# Patient Record
Sex: Male | Born: 1940
Health system: Southern US, Community
[De-identification: ages and names within clinical notes are randomized; demographics above are authoritative.]

## PROBLEM LIST (undated history)

## (undated) DIAGNOSIS — R011 Cardiac murmur, unspecified: Secondary | ICD-10-CM

## (undated) DIAGNOSIS — C61 Malignant neoplasm of prostate: Secondary | ICD-10-CM

## (undated) DIAGNOSIS — I714 Abdominal aortic aneurysm, without rupture, unspecified: Secondary | ICD-10-CM

## (undated) HISTORY — DX: Cardiac murmur, unspecified: R01.1

## (undated) HISTORY — DX: Abdominal aortic aneurysm, without rupture: I71.4

## (undated) HISTORY — DX: Abdominal aortic aneurysm, without rupture, unspecified: I71.40

---

## 1985-08-30 HISTORY — PX: SEPTOPLASTY: SUR1290

## 1998-08-30 HISTORY — PX: VASECTOMY: SHX75

## 2001-10-08 ENCOUNTER — Inpatient Hospital Stay (HOSPITAL_COMMUNITY): Admission: EM | Admit: 2001-10-08 | Discharge: 2001-10-11 | Payer: Self-pay | Admitting: *Deleted

## 2001-10-10 ENCOUNTER — Encounter: Payer: Self-pay | Admitting: Gastroenterology

## 2015-09-15 ENCOUNTER — Other Ambulatory Visit: Payer: Self-pay | Admitting: Family Medicine

## 2015-09-15 DIAGNOSIS — I714 Abdominal aortic aneurysm, without rupture, unspecified: Secondary | ICD-10-CM

## 2015-09-18 ENCOUNTER — Other Ambulatory Visit: Payer: Self-pay | Admitting: Family Medicine

## 2015-09-18 DIAGNOSIS — Z136 Encounter for screening for cardiovascular disorders: Secondary | ICD-10-CM

## 2015-09-22 ENCOUNTER — Ambulatory Visit
Admission: RE | Admit: 2015-09-22 | Discharge: 2015-09-22 | Disposition: A | Discharge status: Home / Self Care | Payer: Medicare Other | Source: Ambulatory Visit | Attending: Family Medicine | Admitting: Family Medicine

## 2015-09-22 DIAGNOSIS — Z136 Encounter for screening for cardiovascular disorders: Secondary | ICD-10-CM

## 2017-12-12 HISTORY — PX: BIOPSY PROSTATE: PRO28

## 2017-12-12 HISTORY — PX: OTHER SURGICAL HISTORY: SHX169

## 2017-12-19 ENCOUNTER — Encounter: Payer: Self-pay | Admitting: Radiation Oncology

## 2017-12-21 ENCOUNTER — Other Ambulatory Visit: Payer: Self-pay | Admitting: Urology

## 2017-12-21 DIAGNOSIS — C61 Malignant neoplasm of prostate: Secondary | ICD-10-CM

## 2017-12-30 ENCOUNTER — Encounter (HOSPITAL_COMMUNITY)
Admission: RE | Admit: 2017-12-30 | Discharge: 2017-12-30 | Disposition: A | Discharge status: Home / Self Care | Payer: Medicare Other | Source: Ambulatory Visit | Attending: Urology | Admitting: Urology

## 2017-12-30 DIAGNOSIS — C61 Malignant neoplasm of prostate: Secondary | ICD-10-CM | POA: Diagnosis present

## 2017-12-30 MED ORDER — TECHNETIUM TC 99M MEDRONATE IV KIT
20.0000 | PACK | Freq: Once | INTRAVENOUS | Status: AC | PRN
  Administered 2017-12-30: 21.3 via INTRAVENOUS

## 2018-01-03 ENCOUNTER — Encounter: Payer: Self-pay | Admitting: Radiation Oncology

## 2018-01-03 NOTE — Progress Notes (Addendum)
GU Location of Tumor / Histology: Prostate adenocarcinoma  If Prostate Cancer, Gleason Score is (4 + 4), PSA in January 2019 was (4.34), and Prostate volume ~37.74 grams  January 2018  PSA  3.03 2017   PSA  Not checked 2016   PSA  Not checked January 2015  PSA  2.21   Patient scheduled to follow up with Dr. Alyson Ingles 01/05/2018.   Calvin Thornton presented about 3 months ago with signs/symptoms of:  To discuss his PSA that had been rising over the last few years (2016 it was 1.99)  Biopsies on 12/12/2017 revealed:   Past/Anticipated interventions by urology, if any:  12/12/2017 TRANSRECTAL ULTRASOUND WITH PROSTATE BIOPSY  Past/Anticipated interventions by medical oncology, if any: None  Weight changes, if any: no  Bowel/Bladder complaints, if any: IPSS 1 with nocturia. Denies dysuria, hematuria, urinary leakage or urinary incontinence.   Nausea/Vomiting, if any: no  Pain issues, if any:  no  SAFETY ISSUES:  Prior radiation? no  Pacemaker/ICD? no  Possible current pregnancy? No  Is the patient on methotrexate? no  Current Complaints / other details:  77 year old male. Second marriage. Two children from previous marriage. 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

## 2018-01-04 ENCOUNTER — Ambulatory Visit
Admission: RE | Admit: 2018-01-04 | Discharge: 2018-01-04 | Disposition: A | Discharge status: Home / Self Care | Payer: Medicare Other | Source: Ambulatory Visit | Attending: Radiation Oncology | Admitting: Radiation Oncology

## 2018-01-04 ENCOUNTER — Encounter: Payer: Self-pay | Admitting: Radiation Oncology

## 2018-01-04 ENCOUNTER — Other Ambulatory Visit: Payer: Self-pay

## 2018-01-04 VITALS — BP 129/65 | HR 61 | Temp 98.4°F | Resp 18 | Wt 166.4 lb

## 2018-01-04 DIAGNOSIS — C61 Malignant neoplasm of prostate: Secondary | ICD-10-CM | POA: Diagnosis present

## 2018-01-04 DIAGNOSIS — F419 Anxiety disorder, unspecified: Secondary | ICD-10-CM | POA: Diagnosis not present

## 2018-01-04 DIAGNOSIS — Z800 Family history of malignant neoplasm of digestive organs: Secondary | ICD-10-CM | POA: Diagnosis not present

## 2018-01-04 DIAGNOSIS — Z79899 Other long term (current) drug therapy: Secondary | ICD-10-CM | POA: Diagnosis not present

## 2018-01-04 HISTORY — DX: Malignant neoplasm of prostate: C61

## 2018-01-04 NOTE — Progress Notes (Signed)
Radiation Oncology         [phone]) [phone] ________________________________  Initial Outpatient Consultation  Name: Calvin Thornton MRN: [id number]  Date: 01/04/2018  DOB: 1941/04/21  CC:[name], PA-C  [name], [name], MD   REFERRING PHYSICIAN: Alyson Ingles [name], MD  DIAGNOSIS: 77 y.o. gentleman with Stage T1c adenocarcinoma of the prostate with Gleason Score of 4+4, and PSA of 4.34    ICD-10-CM   1. Malignant neoplasm of prostate [facility]) [address] Ambulatory referral to Social Work    HISTORY OF PRESENT ILLNESS: Calvin Thornton is a 77 y.o. male with a diagnosis of prostate cancer. He was noted to have an elevated PSA of 2.21 by his primary care physician, Clyde Lundborg, [place] in January 2015. This was followed for several years. His PSA in 08/2016 was 3.03 and he continued to be followed. His PSA was noted to be 4.34 on 09/30/17.  Accordingly, he was referred for evaluation in urology by Dr. Pilar Jarvis on 11/07/17,  digital rectal examination was performed at that time revealing no nodularity. The patient proceeded to transrectal ultrasound with 12 biopsies of the prostate on 12/19/17.  The prostate volume measured 37.74 cc.  Out of 12 core biopsies, 2 were positive.  The maximum Gleason score was 4+4, and this was seen in right mid lateral.  Additionally, Gleason 3+3 was noted in the right base lateral. Dr. Pilar Jarvis was placed on medical leave and the patient was referred to Dr. Alyson Ingles on 12/19/17 to reviewed the results of the biopsy and treatment recommendations. He elected to continue his care with Dr. Alyson Ingles.    He had a CT abdomen and pelvis performed on 12/20/2017 which did not reveal any pathologic lymphadenopathy in the abdomen or pelvis and no evidence of bony metastatic disease.  There was some degenerative disc disease noted.    A whole body bone scan on 01/02/18 showed increased uptake at approximately T6 vertebral body on the right as well as within the right sixth and  seventh ribs posteriorly. The findings could reflect degenerative change but metastatic disease could produce similar findings. The patient does not recall a specific injury to the ribs but reports a fall off a wagon and landing on his back. 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   The patient reviewed the biopsy results with his urologist and he has kindly been referred today for discussion of potential radiation treatment options.   PREVIOUS RADIATION THERAPY: No  PAST MEDICAL HISTORY:  Past Medical History:  Diagnosis Date  . Anxiety   . Prostate cancer ([place])       PAST SURGICAL HISTORY: Past Surgical History:  Procedure Laterality Date  . BIOPSY PROSTATE  12/12/2017  . SEPTOPLASTY  1987  . TRANSRECTAL ULTRA  12/12/2017  . VASECTOMY  2000    FAMILY HISTORY:  Family History  Problem Relation Age of Onset  . Colon cancer Paternal Uncle   . Colon cancer Paternal Grandfather     SOCIAL HISTORY:  Social History   Socioeconomic History  . Marital status: Married    Spouse name: Not on file  . Number of children: Not on file  . Years of education: Not on file  . Highest education level: Not on file  Occupational History  . Not on file  Social Needs  . Financial resource strain: Not on file  . Food insecurity:    Worry: Not on file    Inability: Not on file  .  Transportation needs:    Medical: Not on file    Non-medical: Not on file  Tobacco Use  . Smoking status: Never Smoker  . Smokeless tobacco: Never Used  Substance and Sexual Activity  . Alcohol use: Never    Frequency: Never  . Drug use: Never  . Sexual activity: Yes  Lifestyle  . Physical activity:    Days per week: Not on file    Minutes per session: Not on file  . Stress: Not on file  Relationships  . Social connections:    Talks on phone: Not on file    Gets together: Not on file    Attends religious service: Not on file    Active member of club or organization: Not on  file    Attends meetings of clubs or organizations: Not on file    Relationship status: Not on file  . Intimate partner violence:    Fear of current or ex partner: Not on file    Emotionally abused: Not on file    Physically abused: Not on file    Forced sexual activity: Not on file  Other Topics Concern  . Not on file  Social History Narrative   Two adult children from previous marriage. 5 blood grandchildren plus 3 step grandchildren.    ALLERGIES: Other  MEDICATIONS:  Current Outpatient Medications  Medication Sig Dispense Refill  . Multiple Vitamin (MULTI-VITAMINS) TABS Take by mouth. [employer] Mega Men    . tadalafil (CIALIS) 5 MG tablet TAKE 1 TABLET DAILY     No current facility-administered medications for this encounter.     REVIEW OF SYSTEMS:  On review of systems, the patient reports that he is doing well overall. He denies any chest pain, shortness of breath, cough, fevers, chills, night sweats, unintended weight changes. He denies any bowel disturbances, and denies abdominal pain, nausea or vomiting. He denies any new musculoskeletal or joint aches or pains. His IPSS was 1, indicating mild urinary symptoms. He reports nocturia. He is able to complete sexual activity with half of attempts. A complete review of systems is obtained and is otherwise negative.    PHYSICAL EXAM:  Wt Readings from Last 3 Encounters:  01/04/18 166 lb 6.4 oz (75.5 kg)   Temp Readings from Last 3 Encounters:  01/04/18 98.4 F (36.9 C) (Oral)   BP Readings from Last 3 Encounters:  01/04/18 129/65   Pulse Readings from Last 3 Encounters:  01/04/18 61   Pain Assessment Pain Score: 0-No pain/10  In general this is a well appearing caucasian gentleman in no acute distress. He is alert and oriented x4 and appropriate throughout the examination. HEENT reveals that the patient is normocephalic, atraumatic. EOMs are intact. PERRLA. Skin is intact without any evidence of gross lesions. Cardiovascular  exam reveals a regular rate and rhythm, no clicks rubs or murmurs are auscultated. Chest is clear to auscultation bilaterally. Lymphatic assessment is performed and does not reveal any adenopathy in the cervical, supraclavicular, axillary, or inguinal chains. Abdomen has active bowel sounds in all quadrants and is intact. The abdomen is soft, non tender, non distended. Lower extremities are negative for pretibial pitting edema, deep calf tenderness, cyanosis or clubbing.   KPS = 100  100 - Normal; no complaints; no evidence of disease. 90   - Able to carry on normal activity; minor signs or symptoms of disease. 80   - Normal activity with effort; some signs or symptoms of disease. 28   - Cares for  self; unable to carry on normal activity or to do active work. 60   - Requires occasional assistance, but is able to care for most of his personal needs. 50   - Requires considerable assistance and frequent medical care. 78   - Disabled; requires special care and assistance. 78   - Severely disabled; hospital admission is indicated although death not imminent. 62   - Very sick; hospital admission necessary; active supportive treatment necessary. 10   - Moribund; fatal processes progressing rapidly. 0     - Dead  Karnofsky DA, Abelmann WH, Craver LS and Burchenal JH [phone]) The use of the nitrogen mustards in the palliative treatment of carcinoma: with particular reference to bronchogenic carcinoma Cancer 1 [phone]  LABORATORY DATA:  No results found for: WBC, HGB, HCT, MCV, PLT No results found for: NA, K, CL, CO2 No results found for: ALT, AST, GGT, ALKPHOS, BILITOT   RADIOGRAPHY: Nm Bone Scan Whole Body  Result Date: 01/02/2018 CLINICAL DATA:  Recent diagnosis of prostate malignancy. Rising PSA. No skeletal symptoms. EXAM: NUCLEAR MEDICINE WHOLE BODY BONE SCAN TECHNIQUE: Whole body anterior and posterior images were obtained approximately 3 hours after intravenous injection of radiopharmaceutical.  RADIOPHARMACEUTICALS:  21.3 mCi Technetium-8mMDP IV COMPARISON:  None. FINDINGS: There is adequate uptake of the radiopharmaceutical by the skeleton. There is adequate soft tissue clearance and renal activity. Uptake over the calvarium, cervical spine, and lumbar spine is normal. There is increased uptake at approximately T6 on the right posteriorly. There is subtle increased uptake within the sixth and seventh posterior right ribs. Uptake within the pelvis is within the limits of normal. Uptake within the upper and lower extremities suggest degenerative change but no metastatic foci. IMPRESSION: Increased uptake at approximately T6 vertebral body on the right as well as within the right sixth and seventh ribs posteriorly. The findings could reflect degenerative change but metastatic disease could produce similar findings. A thoracic spine series and right rib series are recommended. Electronically Signed   By: David  JMartiniqueM.D.   On: 01/02/2018 07:35      IMPRESSION/PLAN: 1. 77y.o. gentleman with Stage T1c adenocarcinoma of the prostate with Gleason Score of 4+4, and PSA of 4.34. We discussed the patient's workup and outlined the nature of prostate cancer in this setting. The patient's T stage, Gleason's score, and PSA put him into the high risk group. Accordingly, he is eligible for a variety of potential treatment options including LT ADT in combination with either 8 weeks of external radiation or 5 weeks of external radiation combined with a brachytherapy boost. We discussed the available radiation techniques, and focused on the details and logistics and delivery. We discussed and outlined the risks, benefits, short and long-term effects associated with radiotherapy and compared and contrasted these with prostatectomy. We discussed the role of SpaceOAR in reducing the rectal toxicity associated with radiotherapy. We also detailed the role of ADT in the treatment of high risk prostate cancer and outlined  the associated side effects that could be expected with this therapy.  At the conclusion of our conversation, the patient is interested in moving forward with ADT in combination with brachytherapy with SpaceOAR to reduce rectal toxicity from radiotherapy followed by 5 weeks of prostate EBRT.  We will share our discussion with Dr. MAlyson Inglesand move forward with scheduling an office visit to initiate ADT in the near future.  After approximately 8 weeks of ADT, he will be ready to proceed with brachytherapy followed by 5  weeks of prostate EBRT. The patient met briefly with Romie Jumper in our office who will be working closely with him to coordinate OR scheduling and pre and post procedure appointments.  We will contact the pharmaceutical rep to ensure that [facility] is available at the time of brachytherapy procedure.  He will have a prostate MRI following his prostate CT SIM at the time of his post-seed follow up to confirm appropriate distribution of the [place] and prepare for starting 5 weeks of EBRT.    Nicholos Johns, PA-C    Tyler Pita, MD  [facility] Direct Dial: [phone]  Fax: [phone] [email or web address]  Skype  LinkedIn    Page Me    This document serves as a record of services personally performed by Tyler Pita, MD and Freeman Caldron, PA-C. It was created on their behalf by Bethann Humble, a trained medical scribe. The creation of this record is based on the scribe's personal observations and the provider's statements to them. This document has been checked and approved by the attending provider.

## 2018-01-04 NOTE — Progress Notes (Signed)
See progress note under physician encounter. 

## 2018-01-05 ENCOUNTER — Encounter: Payer: Self-pay | Admitting: General Practice

## 2018-01-05 NOTE — Progress Notes (Signed)
[place] Psychosocial Distress Screening Clinical Social Work  Clinical Social Work was referred by distress screening protocol.  The patient scored a 5 on the Psychosocial Distress Thermometer which indicates moderate distress. Clinical Social Worker Edwyna Shell to assess for distress and other psychosocial needs. Unable to reach patient, VM personally identified.  CSW left detailed VM w information about [place] and available services, encouraged patient to call back if desired or requested mailed information packet.    ONCBCN DISTRESS SCREENING 01/04/2018  Screening Type Initial Screening  Distress experienced in past week (1-10) 5  Emotional problem type Nervousness/Anxiety;Adjusting to illness  Information Concerns Type Lack of info about treatment  Physician notified of physical symptoms Yes  Referral to clinical psychology No  Referral to clinical social work No  Referral to dietition No  Referral to financial advocate No  Referral to support programs No  Referral to palliative care No  Other [phone]    Clinical Social Worker follow up needed: No.  If yes, follow up plan:  Await patient call.  Edwyna Shell, LCSW Clinical Social Worker Phone:  [phone]

## 2018-01-06 ENCOUNTER — Other Ambulatory Visit: Payer: Self-pay | Admitting: Urology

## 2018-01-06 ENCOUNTER — Telehealth: Payer: Self-pay | Admitting: *Deleted

## 2018-01-06 DIAGNOSIS — C61 Malignant neoplasm of prostate: Secondary | ICD-10-CM

## 2018-01-06 NOTE — Telephone Encounter (Signed)
CALLED PATIENT TO INFORM OF ADT  APPT. WITH DR. Alyson Ingles ON 02-03-18 - ARRIVAL TIME - 10 AM, LVM FOR A RETURN CALL

## 2018-01-11 ENCOUNTER — Encounter

## 2018-01-11 ENCOUNTER — Ambulatory Visit: Payer: Medicare Other

## 2018-01-11 ENCOUNTER — Ambulatory Visit: Admission: RE | Admit: 2018-01-11 | Payer: Medicare Other | Source: Ambulatory Visit | Admitting: Radiation Oncology

## 2018-01-13 ENCOUNTER — Telehealth: Payer: Self-pay | Admitting: *Deleted

## 2018-01-13 NOTE — Telephone Encounter (Signed)
Called patient to inform of implant date, lvm for a return call 

## 2018-01-16 ENCOUNTER — Ambulatory Visit
Admission: RE | Admit: 2018-01-16 | Discharge: 2018-01-16 | Disposition: A | Discharge status: Home / Self Care | Payer: Medicare Other | Source: Ambulatory Visit | Attending: Urology | Admitting: Urology

## 2018-01-16 DIAGNOSIS — C61 Malignant neoplasm of prostate: Secondary | ICD-10-CM

## 2018-01-16 MED ORDER — GADOBENATE DIMEGLUMINE 529 MG/ML IV SOLN
15.0000 mL | Freq: Once | INTRAVENOUS | Status: AC | PRN
  Administered 2018-01-16: 15 mL via INTRAVENOUS

## 2018-01-19 ENCOUNTER — Telehealth: Payer: Self-pay | Admitting: *Deleted

## 2018-01-19 NOTE — Telephone Encounter (Signed)
Called patient to remind of pre-seed planning CT and appt. for chest x-ray and EKG for 01-20-18, spoke with patient and he is aware of these appts.

## 2018-01-20 ENCOUNTER — Ambulatory Visit (HOSPITAL_COMMUNITY)
Admission: RE | Admit: 2018-01-20 | Discharge: 2018-01-20 | Disposition: A | Discharge status: Home / Self Care | Payer: Medicare Other | Source: Ambulatory Visit | Attending: Urology | Admitting: Urology

## 2018-01-20 ENCOUNTER — Ambulatory Visit
Admission: RE | Admit: 2018-01-20 | Discharge: 2018-01-20 | Disposition: A | Discharge status: Home / Self Care | Payer: Medicare Other | Source: Ambulatory Visit | Attending: Radiation Oncology | Admitting: Radiation Oncology

## 2018-01-20 ENCOUNTER — Encounter (HOSPITAL_COMMUNITY)
Admission: RE | Admit: 2018-01-20 | Discharge: 2018-01-20 | Disposition: A | Discharge status: Home / Self Care | Payer: Medicare Other | Source: Ambulatory Visit | Attending: Urology | Admitting: Urology

## 2018-01-20 ENCOUNTER — Encounter: Payer: Self-pay | Admitting: Medical Oncology

## 2018-01-20 DIAGNOSIS — Z01818 Encounter for other preprocedural examination: Secondary | ICD-10-CM | POA: Insufficient documentation

## 2018-01-20 DIAGNOSIS — Z510 Encounter for antineoplastic radiation therapy: Secondary | ICD-10-CM | POA: Insufficient documentation

## 2018-01-20 DIAGNOSIS — C61 Malignant neoplasm of prostate: Secondary | ICD-10-CM

## 2018-01-20 NOTE — Progress Notes (Signed)
  Radiation Oncology         [phone]) [phone] ________________________________  Name: Calvin Thornton MRN: [id number]  Date: 01/20/2018  DOB: 1941/06/24  SIMULATION AND TREATMENT PLANNING NOTE PUBIC ARCH STUDY  [id number], Gracelyn Nurse, PA-C  [name], [name], MD  DIAGNOSIS: 77 y.o. male with Stage T1c adenocarcinoma of the prostate with Gleason Score of 4+4, and PSA of 4.34     ICD-10-CM   1. Malignant neoplasm of prostate ([place]) C61     COMPLEX SIMULATION:  The patient presented today for evaluation for possible prostate seed implant. He was brought to the radiation planning suite and placed supine on the CT couch. A 3-dimensional image study set was obtained in upload to the planning computer. There, on each axial slice, I contoured the prostate gland. Then, using three-dimensional radiation planning tools I reconstructed the prostate in view of the structures from the transperineal needle pathway to assess for possible pubic arch interference. In doing so, I did not appreciate any pubic arch interference. Also, the patient's prostate volume was estimated based on the drawn structure. The volume was 28 cc.  Given the pubic arch appearance and prostate volume, patient remains a good candidate to proceed with prostate seed implant. Today, he freely provided informed written consent to proceed.    PLAN: The patient will undergo prostate seed implant.   ________________________________  Sheral Apley. Tammi Klippel, M.D.  This document serves as a record of services personally performed by Tyler Pita, MD. It was created on his behalf by Rae Lips, a trained medical scribe. The creation of this record is based on the scribe's personal observations and the provider's statements to them. This document has been checked and approved by the attending provider.

## 2018-01-20 NOTE — Progress Notes (Signed)
Introduced myself to Calvin Thornton as the prostate nurse navigator and my role. He is here today for CT simulation and instructions for brachytherapy. He states he is ready to get this treatment behind him and get back to life. We discussed the seed implant, radiation, and side effects of ADT. 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 I gave him my business card and asked him to call me with questions or concerns. He voiced understanding.

## 2018-01-26 ENCOUNTER — Telehealth: Payer: Self-pay | Admitting: *Deleted

## 2018-01-26 NOTE — Telephone Encounter (Signed)
Returned patient's phone call, spoke with patient 

## 2018-03-15 ENCOUNTER — Other Ambulatory Visit: Payer: Self-pay | Admitting: Urology

## 2018-03-15 DIAGNOSIS — C61 Malignant neoplasm of prostate: Secondary | ICD-10-CM

## 2018-03-27 ENCOUNTER — Other Ambulatory Visit: Payer: Self-pay | Admitting: Urology

## 2018-03-29 ENCOUNTER — Telehealth: Payer: Self-pay | Admitting: *Deleted

## 2018-03-29 ENCOUNTER — Encounter (HOSPITAL_BASED_OUTPATIENT_CLINIC_OR_DEPARTMENT_OTHER): Payer: Self-pay | Admitting: *Deleted

## 2018-03-29 ENCOUNTER — Other Ambulatory Visit: Payer: Self-pay

## 2018-03-29 NOTE — Progress Notes (Addendum)
Spoke with patient via telephone for pre op interview. NPO after MN can have clear liquids (no milk products) until 6am. No meds to take am of surgery.Labs pending. Pre op lab appt 03/30/18. Fleet enema AM of surgery.

## 2018-03-29 NOTE — Telephone Encounter (Signed)
CALLED PATIENT TO REMIND OF LAB APPT. FOR 03-30-18- ARRIVAL TIME - 8:45 AM @ [facility] ADMITTING, SPOKE WITH PATIENT AND HE IS AWARE OF THIS APPT.

## 2018-03-30 ENCOUNTER — Encounter (HOSPITAL_COMMUNITY)
Admission: RE | Admit: 2018-03-30 | Discharge: 2018-03-30 | Disposition: A | Discharge status: Home / Self Care | Payer: Medicare Other | Source: Ambulatory Visit | Attending: Urology | Admitting: Urology

## 2018-03-30 DIAGNOSIS — Z01812 Encounter for preprocedural laboratory examination: Secondary | ICD-10-CM | POA: Insufficient documentation

## 2018-03-30 LAB — COMPREHENSIVE METABOLIC PANEL
ALT: 25 U/L (ref 0–44)
ANION GAP: 5 (ref 5–15)
AST: 31 U/L (ref 15–41)
Albumin: 3.9 g/dL (ref 3.5–5.0)
Alkaline Phosphatase: 59 U/L (ref 38–126)
BUN: 30 mg/dL — ABNORMAL HIGH (ref 8–23)
CO2: 30 mmol/L (ref 22–32)
CREATININE: 1.15 mg/dL (ref 0.61–1.24)
Calcium: 9.3 mg/dL (ref 8.9–10.3)
Chloride: 103 mmol/L (ref 98–111)
GFR calc non Af Amer: >60 mL/min (ref >60)
Glucose, Bld: 101 mg/dL — ABNORMAL HIGH (ref 70–99)
Potassium: 4.3 mmol/L (ref 3.5–5.1)
SODIUM: 138 mmol/L (ref 135–145)
Total Bilirubin: 1.9 mg/dL — ABNORMAL HIGH (ref 0.3–1.2)
Total Protein: 6.8 g/dL (ref 6.5–8.1)

## 2018-03-30 LAB — APTT: aPTT: 26 seconds (ref 24–36)

## 2018-03-30 LAB — PROTIME-INR
INR: 1.04
Prothrombin Time: 13.5 seconds (ref 11.4–15.2)

## 2018-03-30 LAB — CBC
HCT: 43 % (ref 39.0–52.0)
Hemoglobin: 14.7 g/dL (ref 13.0–17.0)
MCH: 32.9 pg (ref 26.0–34.0)
MCHC: 34.2 g/dL (ref 30.0–36.0)
MCV: 96.2 fL (ref 78.0–100.0)
PLATELETS: 164 10*3/uL (ref 150–400)
RBC: 4.47 MIL/uL (ref 4.22–5.81)
RDW: 12.8 % (ref 11.5–15.5)
WBC: 4.1 10*3/uL (ref 4.0–10.5)

## 2018-04-05 ENCOUNTER — Telehealth: Payer: Self-pay | Admitting: *Deleted

## 2018-04-05 NOTE — Telephone Encounter (Signed)
Called patient to remind of implant for 04-06-18, lvm for a return call

## 2018-04-06 ENCOUNTER — Ambulatory Visit (HOSPITAL_BASED_OUTPATIENT_CLINIC_OR_DEPARTMENT_OTHER): Payer: Medicare Other | Admitting: Anesthesiology

## 2018-04-06 ENCOUNTER — Other Ambulatory Visit: Payer: Self-pay

## 2018-04-06 ENCOUNTER — Encounter (HOSPITAL_BASED_OUTPATIENT_CLINIC_OR_DEPARTMENT_OTHER)
Admission: RE | Disposition: A | Discharge status: Home / Self Care | Payer: Self-pay | Source: Ambulatory Visit | Attending: Urology

## 2018-04-06 ENCOUNTER — Ambulatory Visit (HOSPITAL_COMMUNITY): Payer: Medicare Other

## 2018-04-06 ENCOUNTER — Ambulatory Visit (HOSPITAL_BASED_OUTPATIENT_CLINIC_OR_DEPARTMENT_OTHER)
Admission: RE | Admit: 2018-04-06 | Discharge: 2018-04-06 | Disposition: A | Discharge status: Home / Self Care | Payer: Medicare Other | Source: Ambulatory Visit | Attending: Urology | Admitting: Urology

## 2018-04-06 ENCOUNTER — Encounter (HOSPITAL_BASED_OUTPATIENT_CLINIC_OR_DEPARTMENT_OTHER): Payer: Self-pay | Admitting: *Deleted

## 2018-04-06 DIAGNOSIS — Z01818 Encounter for other preprocedural examination: Secondary | ICD-10-CM

## 2018-04-06 DIAGNOSIS — C61 Malignant neoplasm of prostate: Secondary | ICD-10-CM | POA: Insufficient documentation

## 2018-04-06 HISTORY — PX: CYSTOSCOPY: SHX5120

## 2018-04-06 HISTORY — PX: SPACE OAR INSTILLATION: SHX6769

## 2018-04-06 HISTORY — PX: RADIOACTIVE SEED IMPLANT: SHX5150

## 2018-04-06 SURGERY — INSERTION, RADIATION SOURCE, PROSTATE
Anesthesia: General | Site: Rectum

## 2018-04-06 MED ORDER — FENTANYL CITRATE (PF) 100 MCG/2ML IJ SOLN
INTRAMUSCULAR | Status: AC
  Filled 2018-04-06: qty 2

## 2018-04-06 MED ORDER — LIDOCAINE 2% (20 MG/ML) 5 ML SYRINGE
INTRAMUSCULAR | Status: AC
  Filled 2018-04-06: qty 5

## 2018-04-06 MED ORDER — FLEET ENEMA 7-19 GM/118ML RE ENEM
1.0000 | ENEMA | Freq: Once | RECTAL | Status: AC
  Administered 2018-04-06: 1 via RECTAL
  Filled 2018-04-06: qty 1

## 2018-04-06 MED ORDER — IOHEXOL 300 MG/ML  SOLN
INTRAMUSCULAR | Status: DC | PRN
  Administered 2018-04-06: 7 mL

## 2018-04-06 MED ORDER — ONDANSETRON HCL 4 MG/2ML IJ SOLN
INTRAMUSCULAR | Status: DC | PRN
  Administered 2018-04-06: 4 mg via INTRAVENOUS

## 2018-04-06 MED ORDER — FENTANYL CITRATE (PF) 100 MCG/2ML IJ SOLN
INTRAMUSCULAR | Status: DC | PRN
  Administered 2018-04-06 (×3): 25 ug via INTRAVENOUS
  Administered 2018-04-06: 50 ug via INTRAVENOUS

## 2018-04-06 MED ORDER — DEXAMETHASONE SODIUM PHOSPHATE 4 MG/ML IJ SOLN
INTRAMUSCULAR | Status: DC | PRN
  Administered 2018-04-06: 10 mg via INTRAVENOUS

## 2018-04-06 MED ORDER — SODIUM CHLORIDE 0.9 % IJ SOLN
INTRAMUSCULAR | Status: DC | PRN
  Administered 2018-04-06: 10 mL

## 2018-04-06 MED ORDER — DEXAMETHASONE SODIUM PHOSPHATE 10 MG/ML IJ SOLN
INTRAMUSCULAR | Status: AC
  Filled 2018-04-06: qty 1

## 2018-04-06 MED ORDER — SODIUM CHLORIDE 0.9 % IR SOLN
Status: DC | PRN
  Administered 2018-04-06: 1000 mL via INTRAVESICAL

## 2018-04-06 MED ORDER — MIDAZOLAM HCL 5 MG/5ML IJ SOLN
INTRAMUSCULAR | Status: DC | PRN
  Administered 2018-04-06: 1 mg via INTRAVENOUS

## 2018-04-06 MED ORDER — EPHEDRINE SULFATE-NACL 50-0.9 MG/10ML-% IV SOSY
PREFILLED_SYRINGE | INTRAVENOUS | Status: DC | PRN
  Administered 2018-04-06 (×2): 15 mg via INTRAVENOUS

## 2018-04-06 MED ORDER — MIDAZOLAM HCL 2 MG/2ML IJ SOLN
INTRAMUSCULAR | Status: AC
  Filled 2018-04-06: qty 2

## 2018-04-06 MED ORDER — PROPOFOL 10 MG/ML IV BOLUS
INTRAVENOUS | Status: DC | PRN
  Administered 2018-04-06: 170 mg via INTRAVENOUS
  Administered 2018-04-06 (×4): 20 mg via INTRAVENOUS

## 2018-04-06 MED ORDER — PROPOFOL 10 MG/ML IV BOLUS
INTRAVENOUS | Status: AC
  Filled 2018-04-06: qty 40

## 2018-04-06 MED ORDER — LACTATED RINGERS IV SOLN
INTRAVENOUS | Status: DC
  Administered 2018-04-06: 13:00:00 via INTRAVENOUS
  Filled 2018-04-06: qty 1000

## 2018-04-06 MED ORDER — EPHEDRINE 5 MG/ML INJ
INTRAVENOUS | Status: AC
  Filled 2018-04-06: qty 10

## 2018-04-06 MED ORDER — CEFAZOLIN SODIUM-DEXTROSE 2-4 GM/100ML-% IV SOLN
INTRAVENOUS | Status: AC
  Filled 2018-04-06: qty 100

## 2018-04-06 MED ORDER — CEFAZOLIN SODIUM-DEXTROSE 2-4 GM/100ML-% IV SOLN
2.0000 g | Freq: Once | INTRAVENOUS | Status: AC
  Administered 2018-04-06: 2 g via INTRAVENOUS
  Filled 2018-04-06: qty 100

## 2018-04-06 MED ORDER — TRAMADOL HCL 50 MG PO TABS: 50.0000 mg | ORAL_TABLET | Freq: Four times a day (QID) | ORAL | 0 refills | Status: DC | PRN

## 2018-04-06 MED ORDER — ONDANSETRON HCL 4 MG/2ML IJ SOLN
INTRAMUSCULAR | Status: AC
  Filled 2018-04-06: qty 2

## 2018-04-06 MED ORDER — LIDOCAINE 2% (20 MG/ML) 5 ML SYRINGE
INTRAMUSCULAR | Status: DC | PRN
  Administered 2018-04-06: 100 mg via INTRAVENOUS

## 2018-04-06 MED ORDER — FENTANYL CITRATE (PF) 100 MCG/2ML IJ SOLN
25.0000 ug | INTRAMUSCULAR | Status: DC | PRN
  Filled 2018-04-06: qty 1

## 2018-04-06 MED FILL — traMADol HCL 50 MG TABS: 50 | 7 days supply | Qty: 30 | Fill #0

## 2018-04-06 SURGICAL SUPPLY — 47 items
BAG URINE DRAINAGE (UROLOGICAL SUPPLIES) ×5 IMPLANT
BLADE CLIPPER SURG (BLADE) ×5 IMPLANT
CATH FOLEY 2WAY SLVR  5CC 16FR (CATHETERS) ×4
CATH FOLEY 2WAY SLVR 5CC 16FR (CATHETERS) ×6 IMPLANT
CATH ROBINSON RED A/P 20FR (CATHETERS) ×5 IMPLANT
CLOTH BEACON ORANGE TIMEOUT ST (SAFETY) ×5 IMPLANT
CONT SPECI 4OZ STER CLIK (MISCELLANEOUS) ×10 IMPLANT
COVER BACK TABLE 60X90IN (DRAPES) ×5 IMPLANT
COVER MAYO STAND STRL (DRAPES) ×5 IMPLANT
DRSG TEGADERM 4X4.75 (GAUZE/BANDAGES/DRESSINGS) ×8 IMPLANT
DRSG TEGADERM 8X12 (GAUZE/BANDAGES/DRESSINGS) ×10 IMPLANT
GAUZE SPONGE 4X4 12PLY STRL (GAUZE/BANDAGES/DRESSINGS) ×2 IMPLANT
GLOVE BIO SURGEON STRL SZ 6 (GLOVE) IMPLANT
GLOVE BIO SURGEON STRL SZ7 (GLOVE) IMPLANT
GLOVE BIO SURGEON STRL SZ8 (GLOVE) ×5 IMPLANT
GLOVE BIOGEL PI IND STRL 6 (GLOVE) IMPLANT
GLOVE BIOGEL PI IND STRL 7.0 (GLOVE) IMPLANT
GLOVE BIOGEL PI IND STRL 7.5 (GLOVE) IMPLANT
GLOVE BIOGEL PI IND STRL 8 (GLOVE) IMPLANT
GLOVE BIOGEL PI INDICATOR 6 (GLOVE) ×2
GLOVE BIOGEL PI INDICATOR 7.0 (GLOVE) ×2
GLOVE BIOGEL PI INDICATOR 7.5 (GLOVE) ×2
GLOVE BIOGEL PI INDICATOR 8 (GLOVE)
GLOVE ECLIPSE 6.0 STRL STRAW (GLOVE) ×2 IMPLANT
GLOVE ECLIPSE 6.5 STRL STRAW (GLOVE) ×2 IMPLANT
GLOVE ECLIPSE 7.0 STRL STRAW (GLOVE) ×2 IMPLANT
GLOVE ECLIPSE 8.0 STRL XLNG CF (GLOVE) ×13 IMPLANT
GLOVE INDICATOR 7.0 STRL GRN (GLOVE) IMPLANT
GOWN STRL REUS W/ TWL LRG LVL3 (GOWN DISPOSABLE) IMPLANT
GOWN STRL REUS W/TWL LRG LVL3 (GOWN DISPOSABLE) ×15
GOWN STRL REUS W/TWL XL LVL3 (GOWN DISPOSABLE) ×5 IMPLANT
HOLDER FOLEY CATH W/STRAP (MISCELLANEOUS) ×3 IMPLANT
I Seed AgX100 ×2 IMPLANT
IMPL SPACEOAR SYSTEM 10ML (MISCELLANEOUS) IMPLANT
IMPLANT SPACEOAR SYSTEM 10ML (MISCELLANEOUS) ×5
IV NS 1000ML (IV SOLUTION) ×5
IV NS 1000ML BAXH (IV SOLUTION) ×3 IMPLANT
KIT TURNOVER CYSTO (KITS) ×5 IMPLANT
MANIFOLD NEPTUNE II (INSTRUMENTS) IMPLANT
MARKER SKIN DUAL TIP RULER LAB (MISCELLANEOUS) ×5 IMPLANT
PACK CYSTO (CUSTOM PROCEDURE TRAY) ×5 IMPLANT
SURGILUBE 2OZ TUBE FLIPTOP (MISCELLANEOUS) ×5 IMPLANT
SYR 10ML LL (SYRINGE) ×10 IMPLANT
TOWEL OR 17X24 6PK STRL BLUE (TOWEL DISPOSABLE) ×10 IMPLANT
UNDERPAD 30X30 (UNDERPADS AND DIAPERS) ×10 IMPLANT
WATER STERILE IRR 3000ML UROMA (IV SOLUTION) ×3 IMPLANT
WATER STERILE IRR 500ML POUR (IV SOLUTION) ×5 IMPLANT

## 2018-04-06 NOTE — Anesthesia Preprocedure Evaluation (Addendum)
Anesthesia Evaluation  Patient identified by MRN, date of birth, ID band Patient awake    Reviewed: Allergy & Precautions, NPO status , Patient's Chart, lab work & pertinent test results  Airway Mallampati: II  TM Distance: >3 FB     Dental   Pulmonary    breath sounds clear to auscultation       Cardiovascular negative cardio ROS   Rhythm:Regular Rate:Normal     Neuro/Psych    GI/Hepatic negative GI ROS, Neg liver ROS,   Endo/Other  negative endocrine ROS  Renal/GU negative Renal ROS     Musculoskeletal   Abdominal   Peds  Hematology   Anesthesia Other Findings   Reproductive/Obstetrics                             Anesthesia Physical Anesthesia Plan  ASA: II  Anesthesia Plan: General   Post-op Pain Management:    Induction: Intravenous  PONV Risk Score and Plan: Ondansetron, Dexamethasone and Midazolam  Airway Management Planned: LMA  Additional Equipment:   Intra-op Plan:   Post-operative Plan: Extubation in OR  Informed Consent: I have reviewed the patients History and Physical, chart, labs and discussed the procedure including the risks, benefits and alternatives for the proposed anesthesia with the patient or authorized representative who has indicated his/her understanding and acceptance.   Dental advisory given  Plan Discussed with: CRNA and Anesthesiologist  Anesthesia Plan Comments:         Anesthesia Quick Evaluation

## 2018-04-06 NOTE — Discharge Instructions (Signed)
Radioactive Seed Implant Home Care Instructions   Activity:    Rest for the remainder of the day.  Do not drive or operate equipment today.  You may resume normal  activities in a few days as instructed by your physician, without risk of harmful radiation exposure to those around you, provided you follow the time and distance precautions on the Radiation Oncology Instruction Sheet.   Meals: Drink plenty of lipuids and eat light foods, such as gelatin or soup this evening .  You may return to normal meal plan tomorrow.  Return To Work: You may return to work as instructed by Naval [employer].  Special Instruction:   If any seeds are found, use tweezers to pick up seeds and place in a glass container of any kind and bring to your physician's office.  Call your physician if any of these symptoms occur:   Persistent or heavy bleeding  Urine stream diminishes or stops completely after catheter is removed  Fever equal to or greater than 101 degrees F  Cloudy urine with a strong foul odor  Severe pain  You may feel some burning pain and/or hesitancy when you urinate after the catheter is removed.  These symptoms may increase over the next few weeks, but should diminish within forur to six weeks.  Applying moist heat to the lower abdomen or a hot tub bath may help relieve the pain.  If the discomfort becomes severe, please call your physician for additional medications.    Indwelling Urinary Catheter Care, Adult Take good care of your catheter to keep it working and to prevent problems. How to wear your catheter Attach your catheter to your leg with tape (adhesive tape) or a leg strap. Make sure it is not too tight. If you use tape, remove any bits of tape that are already on the catheter. How to wear a drainage bag You should have:  A large overnight bag.  A small leg bag.  Overnight Bag You may wear the overnight bag at any time. Always keep the bag below the level of your bladder  but off the floor. When you sleep, put a clean plastic bag in a wastebasket. Then hang the bag inside the wastebasket. Leg Bag Never wear the leg bag at night. Always wear the leg bag below your knee. Keep the leg bag secure with a leg strap or tape. How to care for your skin  Clean the skin around the catheter at least once every day.  Shower every day. Do not take baths.  Put creams, lotions, or ointments on your genital area only as told by your doctor.  Do not use powders, sprays, or lotions on your genital area. How to clean your catheter and your skin 1. Wash your hands with soap and water. 2. Wet a washcloth in warm water and gentle (mild) soap. 3. Use the washcloth to clean the skin where the catheter enters your body. Clean downward and wipe away from the catheter in small circles. Do not wipe toward the catheter. 4. Pat the area dry with a clean towel. Make sure to clean off all soap. How to care for your drainage bags Empty your drainage bag when it is ?- full or at least 2-3 times a day. Replace your drainage bag once a month or sooner if it starts to smell bad or look dirty. Do not clean your drainage bag unless told by your doctor. Emptying a drainage bag  Supplies Needed  Rubbing alcohol.  Gauze pad or cotton ball.  Tape or a leg strap.  Steps 1. Wash your hands with soap and water. 2. Separate (detach) the bag from your leg. 3. Hold the bag over the toilet or a clean container. Keep the bag below your hips and bladder. This stops pee (urine) from going back into the tube. 4. Open the pour spout at the bottom of the bag. 5. Empty the pee into the toilet or container. Do not let the pour spout touch any surface. 6. Put rubbing alcohol on a gauze pad or cotton ball. 7. Use the gauze pad or cotton ball to clean the pour spout. 8. Close the pour spout. 9. Attach the bag to your leg with tape or a leg strap. 10. Wash your hands.  Changing a drainage bag Supplies  Needed  Alcohol wipes.  A clean drainage bag.  Adhesive tape or a leg strap.  Steps 1. Wash your hands with soap and water. 2. Separate the dirty bag from your leg. 3. Pinch the rubber catheter with your fingers so that pee does not spill out. 4. Separate the catheter tube from the drainage tube where these tubes connect (at the connection valve). Do not let the tubes touch any surface. 5. Clean the end of the catheter tube with an alcohol wipe. Use a different alcohol wipe to clean the end of the drainage tube. 6. Connect the catheter tube to the drainage tube of the clean bag. 7. Attach the new bag to the leg with adhesive tape or a leg strap. 8. Wash your hands.  How to prevent infection and other problems  Never pull on your catheter or try to remove it. Pulling can damage tissue in your body.  Always wash your hands before and after touching your catheter.  If a leg strap gets wet, replace it with a dry one.  Drink enough fluids to keep your pee clear or pale yellow, or as told by your doctor.  Do not let the drainage bag or tubing touch the floor.  Wear cotton underwear.  If you are male, wipe from front to back after you poop (have a bowel movement).  Check on the catheter often to make sure it works and the tubing is not twisted. Get help if:  Your pee is cloudy.  Your pee smells unusually bad.  Your pee is not draining into the bag.  Your tube gets clogged.  Your catheter starts to leak.  Your bladder feels full. Get help right away if:  You have redness, swelling, or pain where the catheter enters your body.  You have fluid, pus, or a bad smell coming from the area where the catheter enters your body.  The area where the catheter enters your body feels warm.  You have a fever.  You have pain in your: ? Stomach (abdomen). ? Legs. ? Lower back. ? Bladder.  You see blood fill the catheter.  Your pee is pink or red.  You feel sick to your  stomach (nauseous).  You throw up (vomit).  You have chills.  Your catheter gets pulled out. This information is not intended to replace advice given to you by your health care provider. Make sure you discuss any questions you have with your health care provider. Document Released: 12/11/2012 Document Revised: 07/14/2016 Document Reviewed: 01/29/2014 Elsevier Interactive Patient Education  2018 [place] Home Care Instructions  Activity: Get plenty of rest for the remainder of  the day. A responsible individual must stay with you for 24 hours following the procedure.  For the next 24 hours, DO NOT: -Drive a car -Paediatric nurse -Drink alcoholic beverages -Take any medication unless instructed by your physician -Make any legal decisions or sign important papers.  Meals: Start with liquid foods such as gelatin or soup. Progress to regular foods as tolerated. Avoid greasy, spicy, heavy foods. If nausea and/or vomiting occur, drink only clear liquids until the nausea and/or vomiting subsides. Call your physician if vomiting continues.  Special Instructions/Symptoms: Your throat may feel dry or sore from the anesthesia or the breathing tube placed in your throat during surgery. If this causes discomfort, gargle with warm salt water. The discomfort should disappear within 24 hours.  If you had a scopolamine patch placed behind your ear for the management of post- operative nausea and/or vomiting:  1. The medication in the patch is effective for 72 hours, after which it should be removed.  Wrap patch in a tissue and discard in the trash. Wash hands thoroughly with soap and water. 2. You may remove the patch earlier than 72 hours if you experience unpleasant side effects which may include dry mouth, dizziness or visual disturbances. 3. Avoid touching the patch. Wash your hands with soap and water after contact with the patch.

## 2018-04-06 NOTE — Anesthesia Procedure Notes (Signed)
Procedure Name: LMA Insertion Date/Time: 04/06/2018 2:22 PM Performed by: Belinda Block, MD Pre-anesthesia Checklist: Patient identified, Emergency Drugs available, Suction available and Patient being monitored Patient Re-evaluated:Patient Re-evaluated prior to induction Oxygen Delivery Method: Circle system utilized Preoxygenation: Pre-oxygenation with 100% oxygen Induction Type: IV induction Ventilation: Mask ventilation without difficulty LMA: LMA inserted LMA Size: 4.0 Number of attempts: 1 Airway Equipment and Method: Bite block Placement Confirmation: positive ETCO2 Tube secured with: Tape Dental Injury: Teeth and Oropharynx as per pre-operative assessment

## 2018-04-06 NOTE — Transfer of Care (Signed)
Immediate Anesthesia Transfer of Care Note  Patient: Calvin Thornton  Procedure(s) Performed: RADIOACTIVE SEED IMPLANT/BRACHYTHERAPY IMPLANT (N/A Prostate) SPACE OAR INSTILLATION (N/A Rectum) CYSTOSCOPY FLEXIBLE (N/A Bladder)  Patient Location: PACU  Anesthesia Type:General  Level of Consciousness: sedated  Airway & Oxygen Therapy: Patient Spontanous Breathing and Patient connected to face mask oxygen  Post-op Assessment: Report given to RN and Post -op Vital signs reviewed and stable  Post vital signs: Reviewed and stable  Last Vitals:  Vitals Value Taken Time  BP 148/64 04/06/2018  3:45 PM  Temp    Pulse 58 04/06/2018  3:46 PM  Resp 14 04/06/2018  3:46 PM  SpO2 100 % 04/06/2018  3:46 PM  Vitals shown include unvalidated device data.  Last Pain:  Vitals:   04/06/18 1249  TempSrc:   PainSc: 0-No pain      Patients Stated Pain Goal: 8 (95/07/22 5750)  Complications: No apparent anesthesia complications

## 2018-04-06 NOTE — Progress Notes (Signed)
  Radiation Oncology         [phone] ________________________________  Name: Salomon Ganser MRN: [id number]  Date: 04/06/2018  DOB: [birth year]       Prostate Seed Implant  CC:Clyde Lundborg J, PA-C  No ref. provider found  DIAGNOSIS: 77 yo man with Stage T1c adenocarcinoma of the prostate with Gleason Score of 4+4, and PSA of 4.34     ICD-10-CM   1. Pre-op testing Z01.818 DG Chest 2 View    DG Chest 2 View    PROCEDURE: Insertion of radioactive I-125 seeds into the prostate gland.  RADIATION DOSE: 110 Gy, boost therapy.  TECHNIQUE: Kerrigan Glendening was brought to the operating room with the urologist. He was placed in the dorsolithotomy position. He was catheterized and a rectal tube was inserted. The perineum was shaved, prepped and draped. The ultrasound probe was then introduced into the rectum to see the prostate gland.  TREATMENT DEVICE: A needle grid was attached to the ultrasound probe stand and anchor needles were placed.  3D PLANNING: The prostate was imaged in 3D using a sagittal sweep of the prostate probe. These images were transferred to the planning computer. There, the prostate, urethra and rectum were defined on each axial reconstructed image. Then, the software created an optimized 3D plan and a few seed positions were adjusted. The quality of the plan was reviewed using [facility] information for the target and the following two organs at risk:  Urethra and Rectum.  Then the accepted plan was printed and handed off to the radiation therapist.  Under my supervision, the custom loading of the seeds and spacers was carried out and loaded into sealed vicryl sleeves.  These pre-loaded needles were then placed into the needle holder.Marland Kitchen  PROSTATE VOLUME STUDY:  Using transrectal ultrasound the volume of the prostate was verified to be 25.2 cc.  SPECIAL TREATMENT PROCEDURE/SUPERVISION AND HANDLING: The pre-loaded needles were then delivered under sagittal guidance. A total of 25  needles were used to deposit 62 seeds in the prostate gland. The individual seed activity was 0.276 mCi.  SpaceOAR:  Yes  COMPLEX SIMULATION: At the end of the procedure, an anterior radiograph of the pelvis was obtained to document seed positioning and count. Cystoscopy was performed to check the urethra and bladder.  MICRODOSIMETRY: At the end of the procedure, the patient was emitting 0.083 mR/hr at 1 meter. Accordingly, he was considered safe for hospital discharge.  PLAN: The patient will return to the radiation oncology clinic for post implant CT dosimetry and simulation for external radiation on 04/20/18   ________________________________  Sheral Apley. Tammi Klippel, M.D.

## 2018-04-06 NOTE — H&P (Signed)
Urology Admission H&P  Chief Complaint: prostate cancer  History of Present Illness: [name] is a 77yo with a hx of prostate cancer here for definitve therapy. He denies any significant LUTS. No nausea/vomiting. No hematuria  Past Medical History:  Diagnosis Date  . Prostate cancer [facility])    Past Surgical History:  Procedure Laterality Date  . BIOPSY PROSTATE  12/12/2017  . SEPTOPLASTY  1987  . TRANSRECTAL ULTRA  12/12/2017  . VASECTOMY  2000    Home Medications:  Current Facility-Administered Medications  Medication Dose Route Frequency Provider Last Rate Last Dose  . ceFAZolin (ANCEF) IVPB 2g/100 mL premix  2 g Intravenous Once Cleon Gustin, MD      . lactated ringers infusion   Intravenous Continuous Belinda Block, MD 50 mL/hr at 04/06/18 1257     Allergies:  Allergies  Allergen Reactions  . Other Hives    Horse serum    Family History  Problem Relation Age of Onset  . Colon cancer Paternal Uncle   . Colon cancer Paternal Grandfather    Social History:  reports that he has never smoked. He has never used smokeless tobacco. He reports that he drinks alcohol. He reports that he does not use drugs.  Review of Systems  All other systems reviewed and are negative.   Physical Exam:  Vital signs in last 24 hours: Temp:  [98.6 F (37 C)] 98.6 F (37 C) (08/08 1233) Pulse Rate:  [47] 47 (08/08 1233) Resp:  [16] 16 (08/08 1233) BP: (136)/(66) 136/66 (08/08 1233) SpO2:  [100 %] 100 % (08/08 1233) Weight:  [75.1 kg] 75.1 kg (08/08 1233) Physical Exam  Constitutional: He is oriented to person, place, and time. He appears well-developed and well-nourished.  HENT:  Head: Normocephalic and atraumatic.  Eyes: Pupils are equal, round, and reactive to light. EOM are normal.  Neck: Normal range of motion. No thyromegaly present.  Cardiovascular: Normal rate and regular rhythm.  Respiratory: Effort normal. No respiratory distress.  GI: Soft. He exhibits no  distension.  Musculoskeletal: Normal range of motion. He exhibits no edema.  Neurological: He is alert and oriented to person, place, and time.  Skin: Skin is warm and dry.  Psychiatric: He has a normal mood and affect. His behavior is normal. Judgment and thought content normal.    Laboratory Data:  No results found for this or any previous visit (from the past 24 hour(s)). No results found for this or any previous visit (from the past 240 hour(s)). Creatinine: No results for input(s): CREATININE in the last 168 hours. Baseline Creatinine: unknown  Impression/Assessment:  77yo with prostate cancer  Plan:  The risks/benefits/alternatives to brachytherapy with SpaceOAR was explained to the patient and he understands and wishes to proceed with surgery  Nicolette Bang 04/06/2018, 1:36 PM

## 2018-04-06 NOTE — Op Note (Signed)
PRE-OPERATIVE DIAGNOSIS:  Adenocarcinoma of the prostate  POST-OPERATIVE DIAGNOSIS:  Same  PROCEDURE:  Procedure(s): 1. I-125 radioactive seed implantation 2. Cystoscopy 3. Placement of SpaceOAR  SURGEON:  Surgeon(s): Nicolette Bang, MD  Radiation oncologist: Tyler Pita, MD  ANESTHESIA:  General  EBL:  Minimal  DRAINS: 33 French Foley catheter  INDICATION: Calvin Thornton is a 77 year old with a history of T1c high risk prostate cancer. After discussing treatment options he has elected to proceed with brachytherapy  Description of procedure: After informed consent the patient was brought to the major OR, placed on the table and administered general anesthesia. He was then moved to the modified lithotomy position with his perineum perpendicular to the floor. His perineum and genitalia were then sterilely prepped. An official timeout was then performed. A 16 French Foley catheter was then placed in the bladder and filled with dilute contrast, a rectal tube was placed in the rectum and the transrectal ultrasound probe was placed in the rectum and affixed to the stand. He was then sterilely draped.  Real time ultrasonography was used along with the seed planning software [employer] Prostate vs. 4.2.21. This was used to develop the seed plan including the number of needles as well as number of seeds required for complete and adequate coverage. Real-time ultrasonography was then used along with the previously developed plan and the Nucletron device to implant a total of 62 seeds using 25 needles. This proceeded without difficulty or complication.  We then proceeded to mix the SpaceOAR using the kit supplied from the manufacturer. Once this was complete we placed a sinal needle into the perirectal fat between the rectum and the prostate. Once this was accomplished we injected 2cc of normal saline to hydrodissect the plain. We then instilled the the SpaceOAR through the spinal needle and noted  good distribution in the perirectal fat.    A Foley catheter was then removed as well as the transrectal ultrasound probe and rectal probe. Flexible cystoscopy was then performed using the 17 French flexible scope which revealed a normal urethra throughout its length down to the sphincter which appeared intact. The prostatic urethra revealed bilobar hypertrophy but no evidence of obstruction, seeds, spacers or lesions. The bladder was then entered and fully and systematically inspected. The ureteral orifices were noted to be of normal configuration and position. The mucosa revealed no evidence of tumors. There were also no stones identified within the bladder. I noted no seeds or spacers on the floor of the bladder and retroflexion of the scope revealed no seeds protruding from the base of the prostate.  The cystoscope was then removed and a new 95 French Foley catheter was then inserted and the balloon was filled with 10 cc of sterile water. This was connected to closed system drainage and the patient was awakened and taken to recovery room in stable and satisfactory condition. He tolerated procedure well and there were no intraoperative complications.

## 2018-04-07 ENCOUNTER — Encounter (HOSPITAL_BASED_OUTPATIENT_CLINIC_OR_DEPARTMENT_OTHER): Payer: Self-pay | Admitting: Urology

## 2018-04-09 NOTE — Anesthesia Postprocedure Evaluation (Signed)
Anesthesia Post Note  Patient: Calvin Thornton  Procedure(s) Performed: RADIOACTIVE SEED IMPLANT/BRACHYTHERAPY IMPLANT (N/A Prostate) SPACE OAR INSTILLATION (N/A Rectum) CYSTOSCOPY FLEXIBLE (N/A Bladder)     Patient location during evaluation: PACU Anesthesia Type: General Level of consciousness: awake and alert Pain management: pain level controlled Vital Signs Assessment: post-procedure vital signs reviewed and stable Respiratory status: spontaneous breathing, nonlabored ventilation, respiratory function stable and patient connected to nasal cannula oxygen Cardiovascular status: blood pressure returned to baseline and stable Postop Assessment: no apparent nausea or vomiting Anesthetic complications: no    Last Vitals:  Vitals:   04/06/18 1630 04/06/18 1715  BP: (!) 111/94 127/66  Pulse: (!) 49 (!) 48  Resp: 14 14  Temp:  36.6 C  SpO2: 100% 100%    Last Pain:  Vitals:   04/06/18 1715  TempSrc: Oral  PainSc: 3                  Olivier Frayre L Jayston Trevino

## 2018-04-19 ENCOUNTER — Telehealth: Payer: Self-pay | Admitting: *Deleted

## 2018-04-19 NOTE — Telephone Encounter (Signed)
CALLED PATIENT TO REMIND OF POST SEED APPTS. FOR 04-20-18 AND MRI, LVM FOR A RETURN CALL

## 2018-04-20 ENCOUNTER — Ambulatory Visit
Admission: RE | Admit: 2018-04-20 | Discharge: 2018-04-20 | Disposition: A | Discharge status: Home / Self Care | Payer: Medicare Other | Source: Ambulatory Visit | Attending: Radiation Oncology | Admitting: Radiation Oncology

## 2018-04-20 ENCOUNTER — Ambulatory Visit (HOSPITAL_COMMUNITY)
Admission: RE | Admit: 2018-04-20 | Discharge: 2018-04-20 | Disposition: A | Discharge status: Home / Self Care | Payer: Medicare Other | Source: Ambulatory Visit | Attending: Urology | Admitting: Urology

## 2018-04-20 ENCOUNTER — Ambulatory Visit (HOSPITAL_COMMUNITY): Payer: Medicare Other

## 2018-04-20 ENCOUNTER — Ambulatory Visit
Admit: 2018-04-20 | Discharge: 2018-04-20 | Disposition: A | Discharge status: Home / Self Care | Payer: Medicare Other | Attending: Radiation Oncology | Admitting: Radiation Oncology

## 2018-04-20 DIAGNOSIS — C61 Malignant neoplasm of prostate: Secondary | ICD-10-CM | POA: Insufficient documentation

## 2018-04-20 NOTE — Progress Notes (Signed)
  Radiation Oncology         [phone] ________________________________  Name: Calvin Thornton MRN: [id number]  Date: 04/20/2018  DOB: 1941-05-12  COMPLEX SIMULATION NOTE  NARRATIVE:  The patient was brought to the [facility] today following prostate seed implantation approximately one month ago.  Identity was confirmed.  All relevant records and images related to the planned course of therapy were reviewed.  Then, the patient was set-up supine.  CT images were obtained.  The CT images were loaded into the planning software.  Then the prostate and rectum were contoured.  Treatment planning then occurred.  The implanted iodine 125 seeds were identified by the physics staff for projection of radiation distribution  I have requested : 3D Simulation  I have requested a DVH of the following structures: Prostate and rectum.    ________________________________  Sheral Apley Tammi Klippel, M.D.  This document serves as a record of services personally performed by Tyler Pita, MD. It was created on his behalf by Rae Lips, a trained medical scribe. The creation of this record is based on the scribe's personal observations and the provider's statements to them. This document has been checked and approved by the attending provider.

## 2018-04-21 ENCOUNTER — Ambulatory Visit: Payer: Medicare Other | Admitting: Radiation Oncology

## 2018-04-21 NOTE — Progress Notes (Signed)
  Radiation Oncology         [phone] ________________________________  Name: Calvin Thornton MRN: [id number]  Date: 04/20/2018  DOB: December 25, 1940  SIMULATION AND TREATMENT PLANNING NOTE    ICD-10-CM   1. Malignant neoplasm of prostate ([place]) C61     DIAGNOSIS:  77 y.o. man with Stage T1c adenocarcinoma of the prostate with Gleason Score of 4+4, and PSA of 4.34   NARRATIVE:  The patient was brought to the [facility].  Identity was confirmed.  All relevant records and images related to the planned course of therapy were reviewed.  The patient freely provided informed written consent to proceed with treatment after reviewing the details related to the planned course of therapy. The consent form was witnessed and verified by the simulation staff.  Then, the patient was set-up in a stable reproducible supine position for radiation therapy.  A vacuum lock pillow device was custom fabricated to position his legs in a reproducible immobilized position.  Then, I performed a urethrogram under sterile conditions to identify the prostatic apex.  CT images were obtained.  Surface markings were placed.  The CT images were loaded into the planning software.  Then the prostate target and avoidance structures including the rectum, bladder, bowel and hips were contoured.  Treatment planning then occurred.  The radiation prescription was entered and confirmed.  A total of one complex treatment devices were fabricated. I have requested : Intensity Modulated Radiotherapy (IMRT) is medically necessary for this case for the following reason:  Rectal sparing.Marland Kitchen  PLAN:  The patient will receive 45 Gy in 25 fractions of 1.8 Gy, to supplement an up-front prostate seed implant boost of 110 Gy to achieve a total nominal dose of 165 Gy.  ________________________________  Sheral Apley Tammi Klippel, M.D.

## 2018-04-27 DIAGNOSIS — C61 Malignant neoplasm of prostate: Secondary | ICD-10-CM | POA: Diagnosis not present

## 2018-04-28 ENCOUNTER — Encounter: Payer: Self-pay | Admitting: Radiation Oncology

## 2018-04-28 DIAGNOSIS — C61 Malignant neoplasm of prostate: Secondary | ICD-10-CM | POA: Diagnosis not present

## 2018-05-01 NOTE — Progress Notes (Signed)
  Radiation Oncology         [phone] ________________________________  Name: Calvin Thornton MRN: [id number]  Date: 04/28/2018  DOB: Jan 08, 1941  3D Planning Note   Prostate Brachytherapy Post-Implant Dosimetry  Diagnosis: 77 y.o. man with Stage T1c adenocarcinoma of the prostate with Gleason Score of 4+4, and PSA of 4.34   Narrative: On a previous date, Calvin Thornton returned following prostate seed implantation for post implant planning. He underwent CT scan complex simulation to delineate the three-dimensional structures of the pelvis and demonstrate the radiation distribution.  Since that time, the seed localization, and complex isodose planning with dose volume histograms have now been completed.  Results:   Prostate Coverage - The dose of radiation delivered to the 90% or more of the prostate gland (D90) was 112.34% of the prescription dose. This exceeds our goal of greater than 90%. Rectal Sparing - The volume of rectal tissue receiving the prescription dose or higher was 0.0 cc. This falls under our thresholds tolerance of 1.0 cc.  Impression: The prostate seed implant appears to show adequate target coverage and appropriate rectal sparing.  Plan:  The patient will continue to follow with urology for ongoing PSA determinations. I would anticipate a high likelihood for local tumor control with minimal risk for rectal morbidity.  ________________________________  Sheral Apley Tammi Klippel, M.D.

## 2018-05-03 ENCOUNTER — Ambulatory Visit
Admission: RE | Admit: 2018-05-03 | Discharge: 2018-05-03 | Disposition: A | Discharge status: Home / Self Care | Payer: Medicare Other | Source: Ambulatory Visit | Attending: Radiation Oncology | Admitting: Radiation Oncology

## 2018-05-03 DIAGNOSIS — C61 Malignant neoplasm of prostate: Secondary | ICD-10-CM | POA: Diagnosis present

## 2018-05-04 ENCOUNTER — Ambulatory Visit
Admission: RE | Admit: 2018-05-04 | Discharge: 2018-05-04 | Disposition: A | Discharge status: Home / Self Care | Payer: Medicare Other | Source: Ambulatory Visit | Attending: Radiation Oncology | Admitting: Radiation Oncology

## 2018-05-04 DIAGNOSIS — C61 Malignant neoplasm of prostate: Secondary | ICD-10-CM | POA: Diagnosis not present

## 2018-05-05 ENCOUNTER — Ambulatory Visit
Admission: RE | Admit: 2018-05-05 | Discharge: 2018-05-05 | Disposition: A | Discharge status: Home / Self Care | Payer: Medicare Other | Source: Ambulatory Visit | Attending: Radiation Oncology | Admitting: Radiation Oncology

## 2018-05-05 DIAGNOSIS — C61 Malignant neoplasm of prostate: Secondary | ICD-10-CM | POA: Diagnosis not present

## 2018-05-08 ENCOUNTER — Ambulatory Visit
Admission: RE | Admit: 2018-05-08 | Discharge: 2018-05-08 | Disposition: A | Discharge status: Home / Self Care | Payer: Medicare Other | Source: Ambulatory Visit | Attending: Radiation Oncology | Admitting: Radiation Oncology

## 2018-05-08 DIAGNOSIS — C61 Malignant neoplasm of prostate: Secondary | ICD-10-CM | POA: Diagnosis not present

## 2018-05-09 ENCOUNTER — Ambulatory Visit
Admission: RE | Admit: 2018-05-09 | Discharge: 2018-05-09 | Disposition: A | Discharge status: Home / Self Care | Payer: Medicare Other | Source: Ambulatory Visit | Attending: Radiation Oncology | Admitting: Radiation Oncology

## 2018-05-09 DIAGNOSIS — C61 Malignant neoplasm of prostate: Secondary | ICD-10-CM | POA: Diagnosis not present

## 2018-05-10 ENCOUNTER — Ambulatory Visit
Admission: RE | Admit: 2018-05-10 | Discharge: 2018-05-10 | Disposition: A | Discharge status: Home / Self Care | Payer: Medicare Other | Source: Ambulatory Visit | Attending: Radiation Oncology | Admitting: Radiation Oncology

## 2018-05-10 DIAGNOSIS — C61 Malignant neoplasm of prostate: Secondary | ICD-10-CM | POA: Diagnosis not present

## 2018-05-11 ENCOUNTER — Ambulatory Visit
Admission: RE | Admit: 2018-05-11 | Discharge: 2018-05-11 | Disposition: A | Discharge status: Home / Self Care | Payer: Medicare Other | Source: Ambulatory Visit | Attending: Radiation Oncology | Admitting: Radiation Oncology

## 2018-05-11 DIAGNOSIS — C61 Malignant neoplasm of prostate: Secondary | ICD-10-CM | POA: Diagnosis not present

## 2018-05-12 ENCOUNTER — Ambulatory Visit
Admission: RE | Admit: 2018-05-12 | Discharge: 2018-05-12 | Disposition: A | Discharge status: Home / Self Care | Payer: Medicare Other | Source: Ambulatory Visit | Attending: Radiation Oncology | Admitting: Radiation Oncology

## 2018-05-12 DIAGNOSIS — C61 Malignant neoplasm of prostate: Secondary | ICD-10-CM | POA: Diagnosis not present

## 2018-05-15 ENCOUNTER — Ambulatory Visit
Admission: RE | Admit: 2018-05-15 | Discharge: 2018-05-15 | Disposition: A | Discharge status: Home / Self Care | Payer: Medicare Other | Source: Ambulatory Visit | Attending: Radiation Oncology | Admitting: Radiation Oncology

## 2018-05-15 DIAGNOSIS — C61 Malignant neoplasm of prostate: Secondary | ICD-10-CM | POA: Diagnosis not present

## 2018-05-16 ENCOUNTER — Ambulatory Visit
Admission: RE | Admit: 2018-05-16 | Discharge: 2018-05-16 | Disposition: A | Discharge status: Home / Self Care | Payer: Medicare Other | Source: Ambulatory Visit | Attending: Radiation Oncology | Admitting: Radiation Oncology

## 2018-05-16 DIAGNOSIS — C61 Malignant neoplasm of prostate: Secondary | ICD-10-CM | POA: Diagnosis not present

## 2018-05-17 ENCOUNTER — Ambulatory Visit
Admission: RE | Admit: 2018-05-17 | Discharge: 2018-05-17 | Disposition: A | Discharge status: Home / Self Care | Payer: Medicare Other | Source: Ambulatory Visit | Attending: Radiation Oncology | Admitting: Radiation Oncology

## 2018-05-17 DIAGNOSIS — C61 Malignant neoplasm of prostate: Secondary | ICD-10-CM | POA: Diagnosis not present

## 2018-05-18 ENCOUNTER — Ambulatory Visit
Admission: RE | Admit: 2018-05-18 | Discharge: 2018-05-18 | Disposition: A | Discharge status: Home / Self Care | Payer: Medicare Other | Source: Ambulatory Visit | Attending: Radiation Oncology | Admitting: Radiation Oncology

## 2018-05-18 DIAGNOSIS — C61 Malignant neoplasm of prostate: Secondary | ICD-10-CM | POA: Diagnosis not present

## 2018-05-19 ENCOUNTER — Ambulatory Visit
Admission: RE | Admit: 2018-05-19 | Discharge: 2018-05-19 | Disposition: A | Discharge status: Home / Self Care | Payer: Medicare Other | Source: Ambulatory Visit | Attending: Radiation Oncology | Admitting: Radiation Oncology

## 2018-05-19 DIAGNOSIS — C61 Malignant neoplasm of prostate: Secondary | ICD-10-CM | POA: Diagnosis not present

## 2018-05-22 ENCOUNTER — Ambulatory Visit
Admission: RE | Admit: 2018-05-22 | Discharge: 2018-05-22 | Disposition: A | Discharge status: Home / Self Care | Payer: Medicare Other | Source: Ambulatory Visit | Attending: Radiation Oncology | Admitting: Radiation Oncology

## 2018-05-22 DIAGNOSIS — C61 Malignant neoplasm of prostate: Secondary | ICD-10-CM | POA: Diagnosis not present

## 2018-05-23 ENCOUNTER — Ambulatory Visit
Admission: RE | Admit: 2018-05-23 | Discharge: 2018-05-23 | Disposition: A | Discharge status: Home / Self Care | Payer: Medicare Other | Source: Ambulatory Visit | Attending: Radiation Oncology | Admitting: Radiation Oncology

## 2018-05-23 DIAGNOSIS — C61 Malignant neoplasm of prostate: Secondary | ICD-10-CM | POA: Diagnosis not present

## 2018-05-24 ENCOUNTER — Ambulatory Visit
Admission: RE | Admit: 2018-05-24 | Discharge: 2018-05-24 | Disposition: A | Discharge status: Home / Self Care | Payer: Medicare Other | Source: Ambulatory Visit | Attending: Radiation Oncology | Admitting: Radiation Oncology

## 2018-05-24 DIAGNOSIS — C61 Malignant neoplasm of prostate: Secondary | ICD-10-CM | POA: Diagnosis not present

## 2018-05-25 ENCOUNTER — Ambulatory Visit
Admission: RE | Admit: 2018-05-25 | Discharge: 2018-05-25 | Disposition: A | Discharge status: Home / Self Care | Payer: Medicare Other | Source: Ambulatory Visit | Attending: Radiation Oncology | Admitting: Radiation Oncology

## 2018-05-25 DIAGNOSIS — C61 Malignant neoplasm of prostate: Secondary | ICD-10-CM | POA: Diagnosis not present

## 2018-05-26 ENCOUNTER — Ambulatory Visit
Admission: RE | Admit: 2018-05-26 | Discharge: 2018-05-26 | Disposition: A | Discharge status: Home / Self Care | Payer: Medicare Other | Source: Ambulatory Visit | Attending: Radiation Oncology | Admitting: Radiation Oncology

## 2018-05-26 DIAGNOSIS — C61 Malignant neoplasm of prostate: Secondary | ICD-10-CM | POA: Diagnosis not present

## 2018-05-29 ENCOUNTER — Ambulatory Visit
Admission: RE | Admit: 2018-05-29 | Discharge: 2018-05-29 | Disposition: A | Discharge status: Home / Self Care | Payer: Medicare Other | Source: Ambulatory Visit | Attending: Radiation Oncology | Admitting: Radiation Oncology

## 2018-05-29 DIAGNOSIS — C61 Malignant neoplasm of prostate: Secondary | ICD-10-CM | POA: Diagnosis not present

## 2018-05-30 ENCOUNTER — Ambulatory Visit
Admission: RE | Admit: 2018-05-30 | Discharge: 2018-05-30 | Disposition: A | Discharge status: Home / Self Care | Payer: Medicare Other | Source: Ambulatory Visit | Attending: Radiation Oncology | Admitting: Radiation Oncology

## 2018-05-30 DIAGNOSIS — C61 Malignant neoplasm of prostate: Secondary | ICD-10-CM | POA: Diagnosis not present

## 2018-05-31 ENCOUNTER — Ambulatory Visit
Admission: RE | Admit: 2018-05-31 | Discharge: 2018-05-31 | Disposition: A | Discharge status: Home / Self Care | Payer: Medicare Other | Source: Ambulatory Visit | Attending: Radiation Oncology | Admitting: Radiation Oncology

## 2018-05-31 DIAGNOSIS — C61 Malignant neoplasm of prostate: Secondary | ICD-10-CM | POA: Diagnosis not present

## 2018-06-01 ENCOUNTER — Ambulatory Visit
Admission: RE | Admit: 2018-06-01 | Discharge: 2018-06-01 | Disposition: A | Discharge status: Home / Self Care | Payer: Medicare Other | Source: Ambulatory Visit | Attending: Radiation Oncology | Admitting: Radiation Oncology

## 2018-06-01 DIAGNOSIS — C61 Malignant neoplasm of prostate: Secondary | ICD-10-CM | POA: Diagnosis not present

## 2018-06-02 ENCOUNTER — Ambulatory Visit
Admission: RE | Admit: 2018-06-02 | Discharge: 2018-06-02 | Disposition: A | Discharge status: Home / Self Care | Payer: Medicare Other | Source: Ambulatory Visit | Attending: Radiation Oncology | Admitting: Radiation Oncology

## 2018-06-02 DIAGNOSIS — C61 Malignant neoplasm of prostate: Secondary | ICD-10-CM | POA: Diagnosis not present

## 2018-06-05 ENCOUNTER — Ambulatory Visit
Admission: RE | Admit: 2018-06-05 | Discharge: 2018-06-05 | Disposition: A | Discharge status: Home / Self Care | Payer: Medicare Other | Source: Ambulatory Visit | Attending: Radiation Oncology | Admitting: Radiation Oncology

## 2018-06-05 DIAGNOSIS — C61 Malignant neoplasm of prostate: Secondary | ICD-10-CM | POA: Diagnosis not present

## 2018-06-06 ENCOUNTER — Ambulatory Visit
Admission: RE | Admit: 2018-06-06 | Discharge: 2018-06-06 | Disposition: A | Discharge status: Home / Self Care | Payer: Medicare Other | Source: Ambulatory Visit | Attending: Radiation Oncology | Admitting: Radiation Oncology

## 2018-06-06 ENCOUNTER — Encounter: Payer: Self-pay | Admitting: Radiation Oncology

## 2018-06-06 DIAGNOSIS — C61 Malignant neoplasm of prostate: Secondary | ICD-10-CM | POA: Diagnosis not present

## 2018-06-08 NOTE — Progress Notes (Signed)
  Radiation Oncology         [phone]) [phone] ________________________________  Name: Calvin Thornton MRN: [id number]  Date: 06/06/2018  DOB: [birth year]  End of Treatment Note  Diagnosis:   77 y.o. male with Stage T1c adenocarcinoma of the prostate with Gleason Score of 4+4, and PSA of 4.34      Indication for treatment:  Curative, Definitive Radiotherapy       Radiation treatment dates:   05/03/2018 - 06/06/2018  Site/dose:   The prostate was treated to 45 Gy in 25 fractions of 1.8 Gy, to supplement an up-front prostate seed implant boost of 110 Gy performed on 04/06/18, to achieve a total nominal dose of 155 Gy.  Beams/energy:   The patient was treated with IMRT using volumetric arc therapy delivering 6 MV X-rays to clockwise and counterclockwise circumferential arcs with a 90 degree collimator offset to avoid dose scalloping.  Image guidance was performed with daily cone beam CT prior to each fraction to align to gold markers in the prostate and assure proper bladder and rectal fill volumes.  Immobilization was achieved with BodyFix custom mold.  Narrative: The patient tolerated radiation treatment relatively well.   He experienced modest fatigue and some minor urinary irritation with urgency, nocturia x2-3, and dysuria. He denied having hematuria. He denied any bowel issues.  Plan: The patient has completed radiation treatment. He will return to radiation oncology clinic for routine followup in one month. I advised him to call or return sooner if he has any questions or concerns related to his recovery or treatment. ________________________________  Sheral Apley. Tammi Klippel, M.D.  This document serves as a record of services personally performed by Tyler Pita, MD. It was created on his behalf by Rae Lips, a trained medical scribe. The creation of this record is based on the scribe's personal observations and the provider's statements to them. This document has been checked and approved by  the attending provider.

## 2018-07-12 ENCOUNTER — Ambulatory Visit
Admission: RE | Admit: 2018-07-12 | Discharge: 2018-07-12 | Disposition: A | Discharge status: Home / Self Care | Payer: Medicare Other | Source: Ambulatory Visit | Attending: Urology | Admitting: Urology

## 2018-07-12 ENCOUNTER — Encounter: Payer: Self-pay | Admitting: Urology

## 2018-07-12 VITALS — BP 114/67 | HR 47 | Temp 98.1°F | Resp 18 | Ht 69.0 in | Wt 161.0 lb

## 2018-07-12 DIAGNOSIS — Z923 Personal history of irradiation: Secondary | ICD-10-CM | POA: Insufficient documentation

## 2018-07-12 DIAGNOSIS — C61 Malignant neoplasm of prostate: Secondary | ICD-10-CM | POA: Insufficient documentation

## 2018-07-12 NOTE — Addendum Note (Signed)
Encounter addended by: Malena Edman, RN on: 07/12/2018 11:37 AM  Actions taken: Charge Capture section accepted

## 2018-07-12 NOTE — Progress Notes (Addendum)
Radiation Oncology         [phone] ________________________________  Name: Calvin Thornton MRN: [id number]  Date: 07/12/2018  DOB: 1940/10/20  Post Treatment Note  CC: [name], PA-C  [name], [name], MD  Diagnosis:   77 y.o. male with Stage T1c adenocarcinoma of the prostate with Gleason Score of 4+4, and PSA of 4.34      Interval Since Last Radiation:  5 weeks  05/03/2018 - 06/06/2018:  The prostate was treated to 45 Gy in 25 fractions of 1.8 Gy, to supplement an up-front prostate seed implant boost of 110 Gy performed on 04/06/18, to achieve a total nominal dose of 155 Gy.  Narrative:  The patient returns today for routine follow-up.  He tolerated radiation treatment relatively well.  He experienced modest fatigue and some minor urinary irritation with urgency, nocturia x2-3, and dysuria. He denied having hematuria. He denied any bowel issues.  He continued ADT throughout his course of radiation with only minimal noticeable side effects.                           On review of systems, the patient states that he is feeling very well overall. He reports only mild fatigue relieved with 15 minute "power naps" daily and an occasional weaker urine stream at night.  Otherwise, his bowel and bladder issues have completely resolved and he is back at baseline.  His current IPSS score is 4 indicating mild lower urinary tract symptoms only.  He specifically denies any dysuria, gross hematuria, straining to void, incomplete emptying or incontinence.  He continues exercising regularly at the gym 3-4 days/week.  Overall, he is quite pleased with his progress to date.   ALLERGIES:  is allergic to other.  Meds: Current Outpatient Medications  Medication Sig Dispense Refill  . Multiple Vitamin (MULTI-VITAMINS) TABS Take by mouth. [place] Mega Men     No current facility-administered medications for this encounter.     Physical Findings:  height is 5\' 9"  (1.753 m) and weight is 161 lb  (73 kg). His oral temperature is 98.1 F (36.7 C). His blood pressure is 114/67 and his pulse is 47 (abnormal). His respiration is 18 and oxygen saturation is 100%.  Pain Assessment Pain Score: 0-No pain/10 In general this is a well appearing Caucasian gentleman in no acute distress. He's alert and oriented x4 and appropriate throughout the examination. Cardiopulmonary assessment is negative for acute distress and he exhibits normal effort.   Lab Findings: Lab Results  Component Value Date   WBC 4.1 03/30/2018   HGB 14.7 03/30/2018   HCT 43.0 03/30/2018   MCV 96.2 03/30/2018   PLT 164 03/30/2018     Radiographic Findings: No results found.  Impression/Plan: 1. 77 y.o. male with Stage T1c adenocarcinoma of the prostate with Gleason Score of 4+4, and PSA of 4.34. He will continue to follow up with urology for ongoing PSA determinations and has an appointment scheduled with Dr. Alyson Ingles on 08/10/18.  He anticipates completing an 74-month course of androgen deprivation therapy.  He understands what to expect with regards to PSA monitoring going forward. I will look forward to following his response to treatment via correspondence with urology, and would be happy to continue to participate in his care if clinically indicated. I talked to the patient about what to expect in the future, including his risk for erectile dysfunction and rectal bleeding. I encouraged him to call or return to  the office if he has any questions regarding his previous radiation or possible radiation side effects. He was comfortable with this plan and will follow up as needed.     Calvin Johns, PA-C  This document serves as a record of services personally performed by [employer], PA-C. It was created on her behalf by Wilburn Mylar, a trained medical scribe. The creation of this record is based on the scribe's personal observations and the provider's statements to them. This document has been checked and  approved by the attending provider.

## 2018-10-30 ENCOUNTER — Ambulatory Visit (INDEPENDENT_AMBULATORY_CARE_PROVIDER_SITE_OTHER): Payer: Medicare Other | Admitting: Cardiology

## 2018-10-30 ENCOUNTER — Encounter: Payer: Self-pay | Admitting: Cardiology

## 2018-10-30 VITALS — BP 130/72 | HR 52 | Ht 69.0 in | Wt 161.0 lb

## 2018-10-30 DIAGNOSIS — C61 Malignant neoplasm of prostate: Secondary | ICD-10-CM

## 2018-10-30 DIAGNOSIS — I77811 Abdominal aortic ectasia: Secondary | ICD-10-CM | POA: Diagnosis not present

## 2018-10-30 DIAGNOSIS — I358 Other nonrheumatic aortic valve disorders: Secondary | ICD-10-CM

## 2018-10-30 DIAGNOSIS — I6523 Occlusion and stenosis of bilateral carotid arteries: Secondary | ICD-10-CM

## 2018-10-30 DIAGNOSIS — R001 Bradycardia, unspecified: Secondary | ICD-10-CM | POA: Diagnosis not present

## 2018-10-30 NOTE — Progress Notes (Signed)
Subjective:   @Patient  ID@: Calvin Thornton, male    DOB: 01/30/1941, 78 y.o.   MRN: [id number]  Patient referred by [name] for newly noted aortic murmur.  No chief complaint on file.   Patient has prostate cancer and is currently undergoing therapy that was recently found to have aortic systolic murmur on exam.  He is asymptomatic in regards to this. He denies any chest pain, dyspnea on exertion, leg edema, PND, dizziness, syncope, palpitations, or symptoms suggestive of TIA. No history of hypertension, hyperlipidemia, or diabetes.    He does exercise 3 days a week with high intensity interval training that he tolerates well. Has exercised for many years, but was not a long distance runner.  Patient reports that his mother developed abdominal aortic aneurysm later in life that did rupture at age of 37.   He is a retired Geneticist, molecular. Retired 2 years ago.   Past Medical History:  Diagnosis Date  . Prostate cancer [facility])     Past Surgical History:  Procedure Laterality Date  . BIOPSY PROSTATE  12/12/2017  . CYSTOSCOPY N/A 04/06/2018   Procedure: CYSTOSCOPY FLEXIBLE;  Surgeon: Cleon Gustin, MD;  Location: [facility];  Service: Urology;  Laterality: N/A;  no seeds found in bladder  . RADIOACTIVE SEED IMPLANT N/A 04/06/2018   Procedure: RADIOACTIVE SEED IMPLANT/BRACHYTHERAPY IMPLANT;  Surgeon: Cleon Gustin, MD;  Location: [facility];  Service: Urology;  Laterality: N/A;    64    seeds implanted  . SEPTOPLASTY  1987  . SPACE OAR INSTILLATION N/A 04/06/2018   Procedure: SPACE OAR INSTILLATION;  Surgeon: Cleon Gustin, MD;  Location: [facility];  Service: Urology;  Laterality: N/A;  . TRANSRECTAL ULTRA  12/12/2017  . VASECTOMY  2000    Family History  Problem Relation Age of Onset  . Colon cancer Paternal Uncle   . Colon cancer Paternal Grandfather     Social History   Socioeconomic  History  . Marital status: Married    Spouse name: Not on file  . Number of children: Not on file  . Years of education: Not on file  . Highest education level: Not on file  Occupational History  . Not on file  Social Needs  . Financial resource strain: Not on file  . Food insecurity:    Worry: Not on file    Inability: Not on file  . Transportation needs:    Medical: Not on file    Non-medical: Not on file  Tobacco Use  . Smoking status: Never Smoker  . Smokeless tobacco: Never Used  Substance and Sexual Activity  . Alcohol use: Yes    Frequency: Never    Comment: occasionally  . Drug use: Never  . Sexual activity: Yes  Lifestyle  . Physical activity:    Days per week: Not on file    Minutes per session: Not on file  . Stress: Not on file  Relationships  . Social connections:    Talks on phone: Not on file    Gets together: Not on file    Attends religious service: Not on file    Active member of club or organization: Not on file    Attends meetings of clubs or organizations: Not on file    Relationship status: Not on file  . Intimate partner violence:    Fear of current or ex partner: No    Emotionally abused: No  Physically abused: No    Forced sexual activity: No  Other Topics Concern  . Not on file  Social History Narrative   Two adult children from previous marriage. 5 blood grandchildren plus 3 step grandchildren.    Current Outpatient Medications on File Prior to Visit  Medication Sig Dispense Refill  . Multiple Vitamin (MULTI-VITAMINS) TABS Take by mouth. [place] Mega Men     No current facility-administered medications on file prior to visit.    Abdominal aortic duplex 09/22/2015: Aortoiliac ectasia. Maximum diameter of the abdominal aorta is 2.6 cm. Ectatic abdominal aorta at risk for aneurysm development. Recommend followup by ultrasound in 5 years.  Review of Systems  Constitution: Negative for decreased appetite, malaise/fatigue, weight gain and  weight loss.  Eyes: Negative for visual disturbance.  Cardiovascular: Negative for chest pain, claudication, dyspnea on exertion, leg swelling, orthopnea, palpitations and syncope.  Respiratory: Negative for hemoptysis and wheezing.   Endocrine: Negative for cold intolerance and heat intolerance.  Hematologic/Lymphatic: Does not bruise/bleed easily.  Skin: Negative for nail changes.  Musculoskeletal: Negative for muscle weakness and myalgias.  Gastrointestinal: Negative for abdominal pain, change in bowel habit, nausea and vomiting.  Neurological: Negative for difficulty with concentration, dizziness, focal weakness and headaches.  Psychiatric/Behavioral: Negative for altered mental status and suicidal ideas.  All other systems reviewed and are negative.      Objective:   Physical Exam  Constitutional: He is oriented to person, place, and time. Vital signs are normal. He appears well-developed and well-nourished.  HENT:  Head: Normocephalic and atraumatic.  Neck: Normal range of motion.  Cardiovascular: Regular rhythm and intact distal pulses. Bradycardia present.  Murmur heard.  Harsh midsystolic murmur is present at the upper right sternal border radiating to the neck. Pulmonary/Chest: Effort normal and breath sounds normal. No accessory muscle usage. No respiratory distress.  Abdominal: Soft. Bowel sounds are normal.  Musculoskeletal: Normal range of motion.  Neurological: He is alert and oriented to person, place, and time.  Skin: Skin is warm and dry.  Vitals reviewed.       Assessment & Recommendations:   1. Aortic systolic murmur on examination Recently noted. Has early to mid systolic murmur that I suspect is innocent murmur or flow murmur from vigorous LV function from high exercise capacity. He could also possibly have aortic stenosis, that if present, is likely mild. He is asymptomatic. Will schedule for echocardiogram for further evaluation.   2.  Bradycardia Related to athletic state. Asymptomatic.   3. Bilateral carotid artery stenosis No history of hyperlipidemia and is asymptomatic. Likely conducted sound from aortic murmur. Do not feel that he needs carotid duplex at this point.   4. Abdominal aortic ectasia (HCC) Has family history of abdominal aortic aneurysm and was noted to have abdominal iliac ectasia. Repeat duplex was recommended for in 5 years. Will have this performed with his echocardiogram.  5. Malignant neoplasm of prostate ([place]) Currently undergoing treatment. Being followed by Dr. Alyson Ingles.   I will see him back after the testing.   Thank you for referring this patient. Please do not hesitate to contact me for any questions.   Jeri Lager, FNP-C [facility], [place] Office: [phone] Fax: [phone]

## 2018-11-16 ENCOUNTER — Other Ambulatory Visit: Payer: Self-pay

## 2018-11-16 ENCOUNTER — Ambulatory Visit: Payer: Medicare Other

## 2018-11-16 DIAGNOSIS — I77811 Abdominal aortic ectasia: Secondary | ICD-10-CM

## 2018-11-16 DIAGNOSIS — I358 Other nonrheumatic aortic valve disorders: Secondary | ICD-10-CM | POA: Diagnosis not present

## 2018-11-19 ENCOUNTER — Other Ambulatory Visit: Payer: Self-pay | Admitting: Cardiology

## 2018-11-19 DIAGNOSIS — I714 Abdominal aortic aneurysm, without rupture, unspecified: Secondary | ICD-10-CM

## 2018-11-19 NOTE — Progress Notes (Unsigned)
aor 

## 2018-11-29 ENCOUNTER — Other Ambulatory Visit: Payer: Self-pay

## 2018-11-29 ENCOUNTER — Ambulatory Visit (INDEPENDENT_AMBULATORY_CARE_PROVIDER_SITE_OTHER): Payer: Medicare Other | Admitting: Cardiology

## 2018-11-29 ENCOUNTER — Encounter: Payer: Self-pay | Admitting: Cardiology

## 2018-11-29 DIAGNOSIS — I350 Nonrheumatic aortic (valve) stenosis: Secondary | ICD-10-CM | POA: Diagnosis not present

## 2018-11-29 DIAGNOSIS — I714 Abdominal aortic aneurysm, without rupture, unspecified: Secondary | ICD-10-CM

## 2018-11-29 NOTE — Progress Notes (Signed)
Subjective:   Patient: Calvin Thornton, male    DOB: 08-Jul-1941, 78 y.o.   MRN: [id number]    No chief complaint on file.  This visit type was conducted due to national recommendations for restrictions regarding the COVID-19 Pandemic (e.g. social distancing).  This format is felt to be most appropriate for this patient at this time.  All issues noted in this document were discussed and addressed.  No physical exam was performed (except for noted visual exam findings with Telehealth visits).  The patient has consented to conduct a Telehealth visit and understands insurance will be billed.   I discussed the limitations of evaluation and management by telemedicine and the availability of in person appointments. The patient expressed understanding and agreed to proceed.  Virtual Visit via Video Note is as below  I connected with [name], on 11/29/18 at 1030 by a video enabled telemedicine application and verified that I am speaking with the correct person using two identifiers.     I have discussed with her regarding the safety during COVID Pandemic and steps and precautions including social distancing with the patient.   HPI: Mr. Calvin Thornton is a pleasant  78 year old Caucasian male with prostate cancer, in which he is currently undergoing therapy, and abdominal aortic ectasia by [place] in 2017, that was recently found to have aortic systolic murmur on exam.   He underwent echocardiogram on 11/16/2018 revealing mild aortic stenosis, normal LVEF and LV size. Also underwent abdominal aortic duplex on 11/16/2018 that found AAA measuring approx. 3 cm. He was noted to have increased velocity suggesting <50% stenosis of right internal iliac artery. This is a follow up visit to discuss these results.   He is asymptomatic. He denies any chest pain, dyspnea on exertion, leg edema, PND, dizziness, syncope, palpitations, or symptoms suggestive of claudication or TIA. There is no history of  hypertension, hyperlipidemia, or diabetes.  I do not have recent lipid panel.  He does exercise 3 days a week with high intensity interval training that he tolerates very well. States that he is the oldest member of the class and is able to tolerate the exercises better than younger participants. Has exercised for many years, but was not a long distance runner. He is also active with maintaining a farm.   Patient reports that his mother developed abdominal aortic aneurysm later in life that did rupture at age of 16.   He is a retired Geneticist, molecular. Retired 2 years ago.   Past Medical History:  Diagnosis Date  . Heart murmur   . Prostate cancer [facility])     Past Surgical History:  Procedure Laterality Date  . BIOPSY PROSTATE  12/12/2017  . CYSTOSCOPY N/A 04/06/2018   Procedure: CYSTOSCOPY FLEXIBLE;  Surgeon: Cleon Gustin, MD;  Location: [facility];  Service: Urology;  Laterality: N/A;  no seeds found in bladder  . RADIOACTIVE SEED IMPLANT N/A 04/06/2018   Procedure: RADIOACTIVE SEED IMPLANT/BRACHYTHERAPY IMPLANT;  Surgeon: Cleon Gustin, MD;  Location: [facility];  Service: Urology;  Laterality: N/A;    64    seeds implanted  . SEPTOPLASTY  1987  . SPACE OAR INSTILLATION N/A 04/06/2018   Procedure: SPACE OAR INSTILLATION;  Surgeon: Cleon Gustin, MD;  Location: [facility];  Service: Urology;  Laterality: N/A;  . TRANSRECTAL ULTRA  12/12/2017  . VASECTOMY  2000    Family History  Problem Relation Age of Onset  .  Colon cancer Paternal Uncle   . Colon cancer Paternal Grandfather   . AAA (abdominal aortic aneurysm) Mother   . Bleeding Disorder Father     Social History   Socioeconomic History  . Marital status: Married    Spouse name: Not on file  . Number of children: 1  . Years of education: Not on file  . Highest education level: Not on file  Occupational History  . Not on file  Social Needs  . Financial  resource strain: Not on file  . Food insecurity:    Worry: Not on file    Inability: Not on file  . Transportation needs:    Medical: Not on file    Non-medical: Not on file  Tobacco Use  . Smoking status: Never Smoker  . Smokeless tobacco: Never Used  Substance and Sexual Activity  . Alcohol use: Yes    Frequency: Never    Comment: daily   . Drug use: Never  . Sexual activity: Yes  Lifestyle  . Physical activity:    Days per week: Not on file    Minutes per session: Not on file  . Stress: Not on file  Relationships  . Social connections:    Talks on phone: Not on file    Gets together: Not on file    Attends religious service: Not on file    Active member of club or organization: Not on file    Attends meetings of clubs or organizations: Not on file    Relationship status: Not on file  . Intimate partner violence:    Fear of current or ex partner: No    Emotionally abused: No    Physically abused: No    Forced sexual activity: No  Other Topics Concern  . Not on file  Social History Narrative   Two adult children from previous marriage. 5 blood grandchildren plus 3 step grandchildren.    Current Outpatient Medications on File Prior to Visit  Medication Sig Dispense Refill  . Leuprolide Acetate, 6 Month, (LUPRON DEPOT, 62-MONTH, IM) Inject into the muscle every 6 (six) months.    . Multiple Vitamins-Minerals (MEGA MULTI MEN PO) Take by mouth.     No current facility-administered medications on file prior to visit.     Review of Systems  Constitution: Negative for decreased appetite, malaise/fatigue, weight gain and weight loss.  Eyes: Negative for visual disturbance.  Cardiovascular: Negative for chest pain, claudication, dyspnea on exertion, leg swelling, orthopnea, palpitations and syncope.  Respiratory: Negative for hemoptysis and wheezing.   Endocrine: Negative for cold intolerance and heat intolerance.  Hematologic/Lymphatic: Does not bruise/bleed easily.   Skin: Negative for nail changes.  Musculoskeletal: Negative for muscle weakness and myalgias.  Gastrointestinal: Negative for abdominal pain, change in bowel habit, nausea and vomiting.  Neurological: Negative for difficulty with concentration, dizziness, focal weakness and headaches.  Psychiatric/Behavioral: Negative for altered mental status and suicidal ideas.  All other systems reviewed and are negative.      Objective:   Physical Exam  Constitutional: He is oriented to person, place, and time. He appears well-developed and well-nourished.  Pulmonary/Chest: Effort normal.  Neurological: He is alert and oriented to person, place, and time.  Psychiatric: He has a normal mood and affect. His behavior is normal.   Cardiac Studies:   Echocardiogram 11/16/2018: Left ventricle cavity is normal in size. Mild concentric hypertrophy of the left ventricle. Normal global wall motion. Normal diastolic filling pattern. Calculated EF 55%. Right atrial cavity  is normal in size. Mild aortic valve stenosis.  Aortic valve mean gradient of 9 mmHg, Vmax of 2.3  m/s. Calculated aortic valve area by continuity equation is 1.1 cm. (Based on 2D images, valve area appears underestimated.) Mild (Grade I) regurgitation. Mild tricuspid regurgitation. Estimated pulmonary artery systolic pressure 28 mmHg.  Abdominal Aortic Duplex  11/16/2018 Moderate dilatation of the abdominal aorta is noted in the mid aorta.  An abdominal aortic aneurysm measuring 3 x 3.02 x 3.01 cm is seen.  Peak systolic velocities in the right internal iliac artery are mildly increased to 197.86 cm/s, <50% stenosis. Enlarged inferior vena cava with normal respiratory variation suggests elevated right heart pressure.  Recheck in 1 year.  Abdominal aortic duplex 09/22/2015: Aortoiliac ectasia. Maximum diameter of the abdominal aorta is 2.6 cm. Ectatic abdominal aorta at risk for aneurysm development. Recommend followup by ultrasound in 5  years.      Assessment & Recommendations:  1. Abdominal aortic aneurysm (AAA) without rupture ([place]) Noted to a small 3cm AAA on recent aortic duplex. I have discussed the abdominal aortic duplex findings with the patient. As his mother also had this, I suspect genetic component.   Will continue to need surveillance of this, will repeat in 1 year. Advised him that he should seek evaluation for any unexplained severe abdominal pain, although given small size, risk for rupture at this point is low. I have advised him to start daily 81mg  aspirin. He would also likely benefit from statin as there is some data to support this in patients with AAA, which was also discussed with him, but would like to obtain his most recent lipid panel from PCP for further risk stratification prior to initiating therapy. He does have increased velocity of right iliac artery noted on duplex. No history of tobacco use. No symptoms of claudication. Will continue to monitor this with his annual follow up duplex, could consider further evaluation if suspicion for PAD is high.   I have also discussed using losartan to help with decreasing expansion rate, although he does not have hypertension. Patient would like to research this and let me know. I have provided him with some research articles from Up to Date to help him with this. He does question if he should continue with his normal exercises. Given the small size of the AAA, I see no contraindication at this point. Exercise information with AAA was also provided.   2. Mild aortic stenosis Echocardiogram was discussed. Noted to have mild AS, which is the source of the murmur, and mild AR. Etiology of AS was discussed. He is asymptomatic in regards to this. Will continue with clinical surveillance. Will consider repeating echocardiogram in 2 years or sooner if clinically indicated.   Plan: Patient will research using Losartan with AAA and will let me know if he wishes to start the  medication. Once I have received his lipid panel from PCP office, I will further discuss with the patient about if he wishes to start statin. Otherwise, we will see him back in 1 year after repeat abdominal aortic duplex.    *I have discussed this case with Dr. Einar Gip and he participated in formulating the plan.Jeri Lager, MSN, APRN, FNP-C [facility] Cardiovascular, [place] Office: [phone] Fax: [phone]

## 2018-12-04 ENCOUNTER — Encounter: Payer: Self-pay | Admitting: Cardiology

## 2018-12-22 NOTE — Telephone Encounter (Signed)
Forwarded message

## 2019-03-15 IMAGING — MR MR THORACIC SPINE WO/W CM
4 of 8 series · 17 of 48 positions shown · IV contrast (multihance)
Comparison: Abdominal/pelvic CT scan 12/20/2017 and whole-body bone
scan 12/30/2017.

CLINICAL DATA: New diagnosis prostate cancer.  Abnormal bone scan.

Creatinine was obtained on site at [HOSPITAL] at [HOSPITAL].
Results: Creatinine 1.1 mg/dL.
EXAM:
MRI THORACIC WITHOUT AND WITH CONTRAST
TECHNIQUE: Multiplanar and multiecho pulse sequences of the thoracic spine were
obtained without and with intravenous contrast.
CONTRAST:  15mL MULTIHANCE GADOBENATE DIMEGLUMINE 529 MG/ML IV SOLN

[Series 5: T1 · sagittal · 3.0mm · 0.50mm/px · 3 of 13 slices shown (1 of 2)]
[im 1/13]
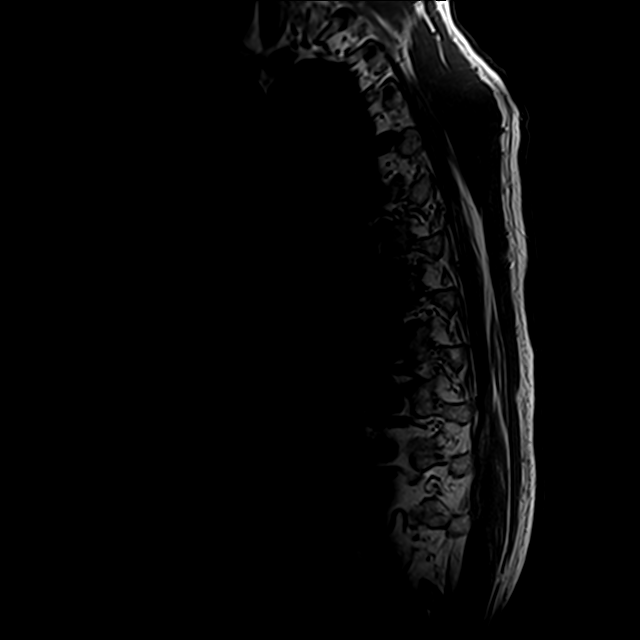
[im 9/13]
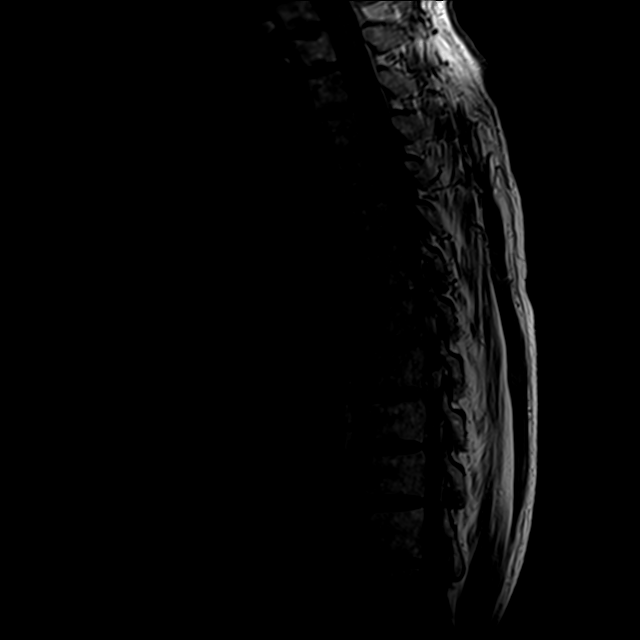
[im 13/13]
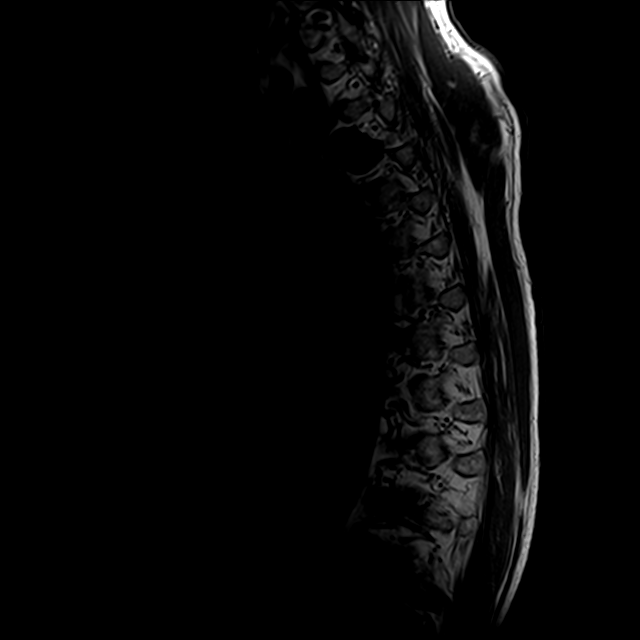

[Series 6: T2 · axial · 4.0mm · 0.39mm/px · z∈[-302,-79]mm · 8 of 28 slices shown (1 of 2)]
[im 1/28]
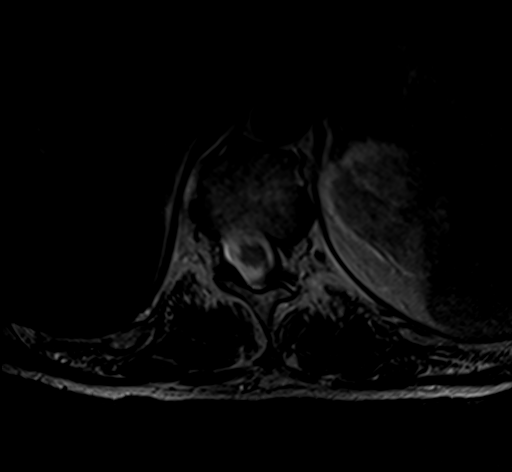
[im 4/28]
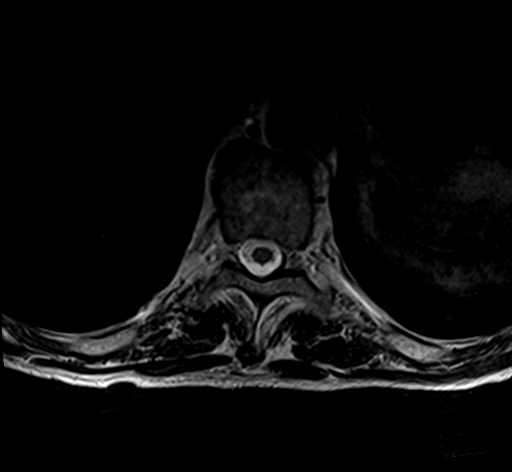
[im 8/28]
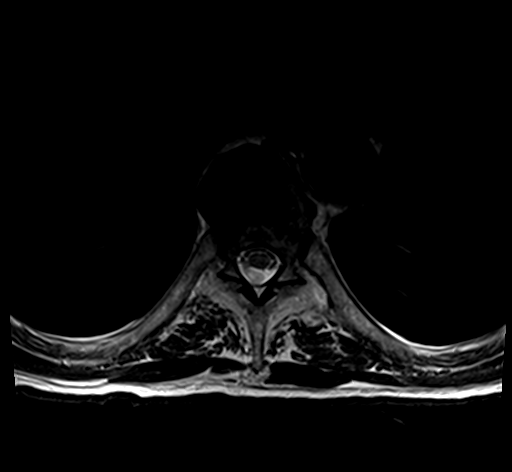
[im 12/28]
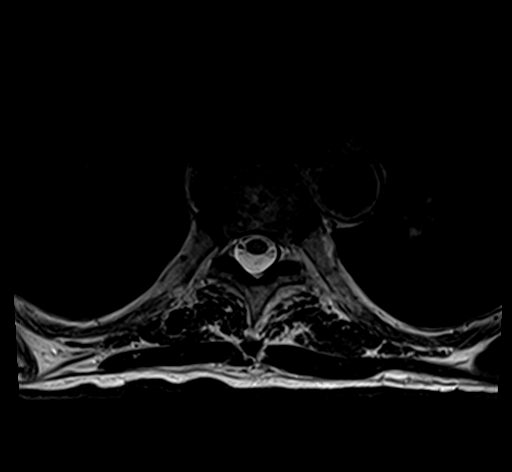
[im 16/28]
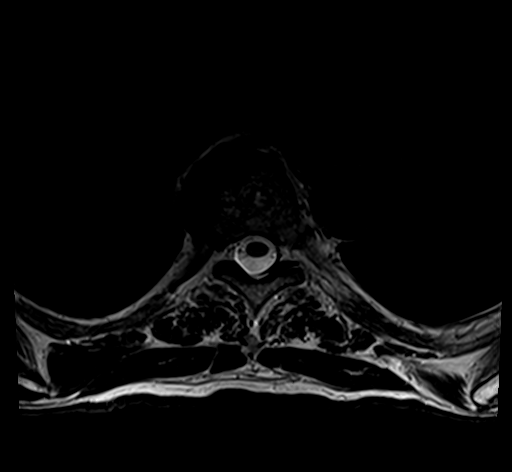
[im 20/28]
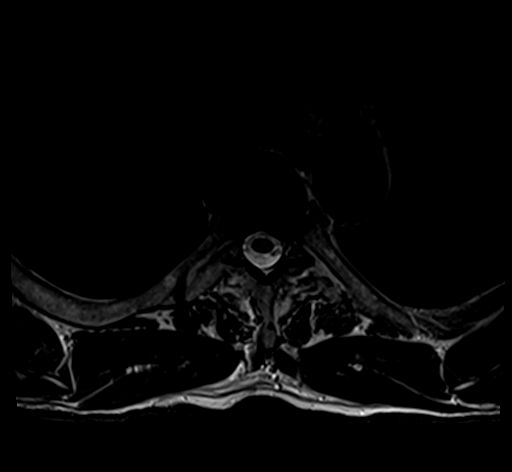
[im 24/28]
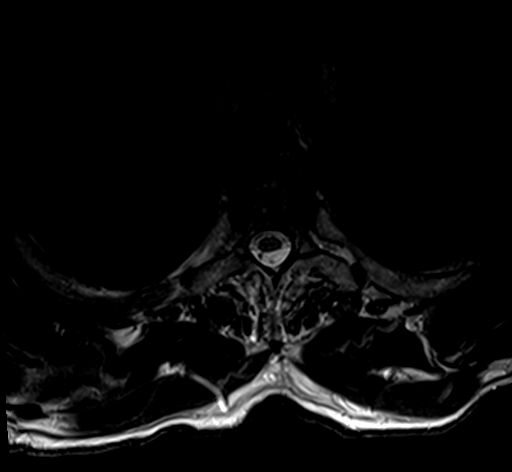
[im 28/28]
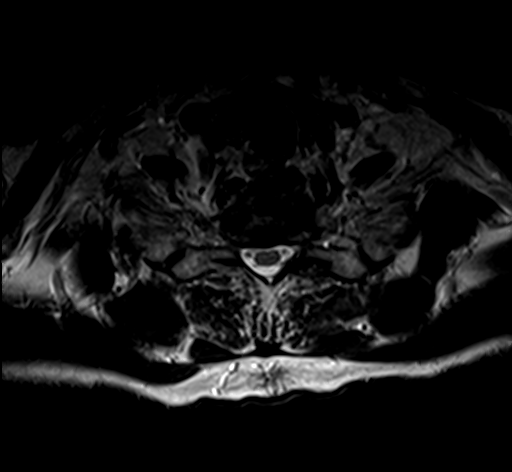

[Series 8: T1 · axial · 4.0mm · 0.39mm/px · z∈[-274,-113]mm · 3 of 28 slices shown (2 of 2)]
[im 4/28]
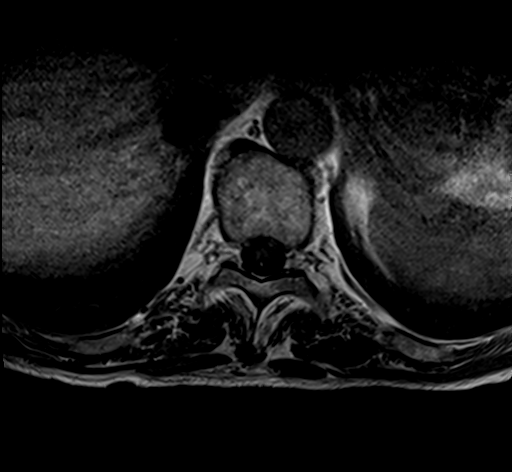
[im 16/28]
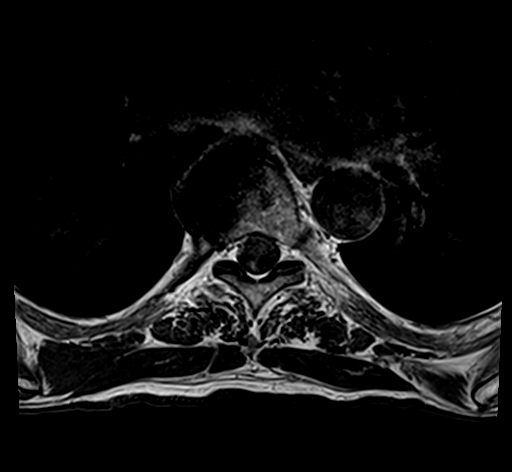
[im 24/28]
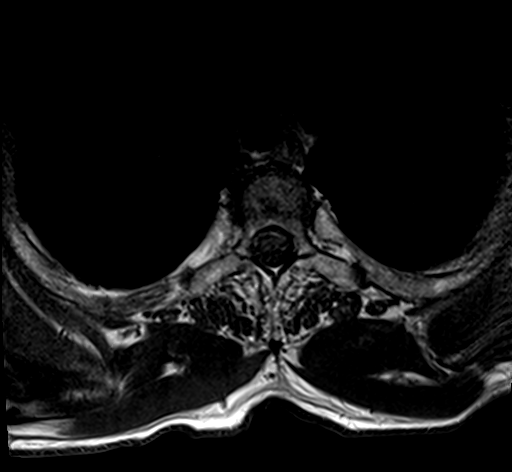

[Series 9: T2 · sagittal · 3.0mm · 0.50mm/px · 3 of 15 slices shown (2 of 2)]
[im 1/15]
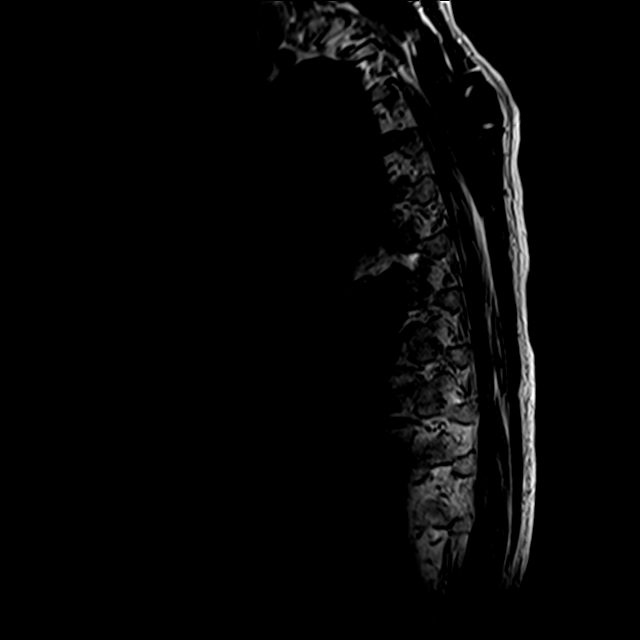
[im 10/15]
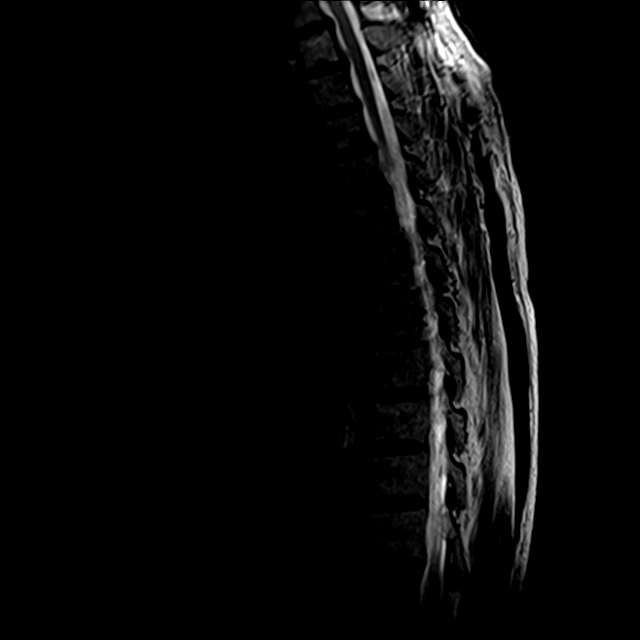
[im 15/15]
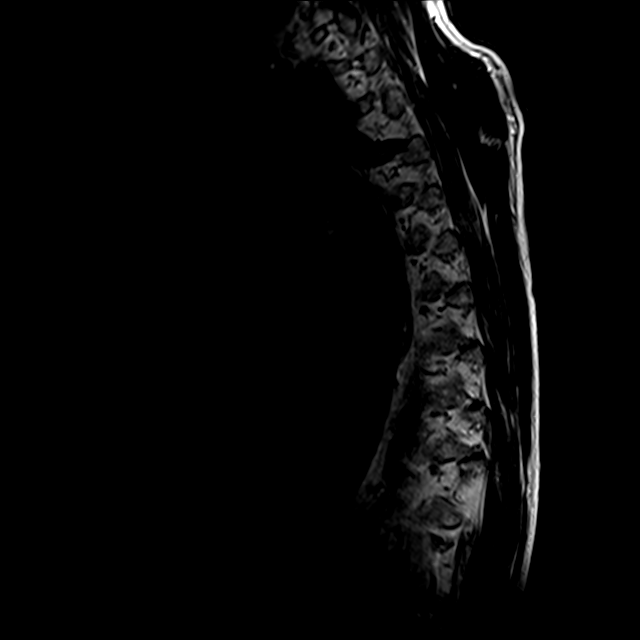

[17 of 48 positions shown; findings below may reference images not displayed]

FINDINGS: MRI THORACIC SPINE FINDINGS

Alignment:  Normal

Vertebrae: There is a lesion in the T6 vertebral body on the right
side. This has more the appearance of an atypical/vascular
hemangioma than a metastatic lesion. There is also degenerative
changes with a right-sided spurring which may account for the bone
scan abnormality. No other bone lesions are identified. Moderate
degenerate disc disease at multiple levels.

Cord:  Normal cord signal.  No cord lesions or syrinx.

Paraspinal and other soft tissues: No paraspinal process or obvious
lung lesions. No obvious mediastinal or hilar mass or adenopathy.

Disc levels:

Small left paracentral disc protrusion is noted at T1-2.

Degenerate disc disease with osteophytic ridging and shallow
bilateral disc protrusions at T3-4. Mild mass effect on both sides
of the thecal sac.

Small left paracentral disc protrusion at T4-5.

Small left paracentral disc protrusion at T5-6.
IMPRESSION: 1. Lesion in the T6 vertebral body on the right side is more likely
an atypical/vascular hemangioma than a metastatic lesion. No other
bone lesions are identified. A follow-up MR examination in 4 months
is suggested to document stability.
2. Small thoracic disc protrusions as detailed above.

## 2019-10-29 ENCOUNTER — Other Ambulatory Visit: Payer: Self-pay

## 2019-10-29 ENCOUNTER — Ambulatory Visit: Payer: Medicare Other

## 2019-10-29 DIAGNOSIS — I714 Abdominal aortic aneurysm, without rupture, unspecified: Secondary | ICD-10-CM

## 2019-11-04 ENCOUNTER — Other Ambulatory Visit: Payer: Self-pay | Admitting: Cardiology

## 2019-11-04 DIAGNOSIS — I714 Abdominal aortic aneurysm, without rupture, unspecified: Secondary | ICD-10-CM

## 2019-11-05 ENCOUNTER — Ambulatory Visit: Payer: Medicare Other | Admitting: Cardiology

## 2019-11-08 ENCOUNTER — Encounter: Payer: Self-pay | Admitting: Cardiology

## 2019-11-08 ENCOUNTER — Telehealth: Payer: Medicare Other | Admitting: Cardiology

## 2019-11-08 VITALS — BP 106/64 | HR 52 | Temp 98.6°F | Wt 160.0 lb

## 2019-11-08 DIAGNOSIS — I714 Abdominal aortic aneurysm, without rupture, unspecified: Secondary | ICD-10-CM

## 2019-11-08 DIAGNOSIS — Z712 Person consulting for explanation of examination or test findings: Secondary | ICD-10-CM

## 2019-11-08 DIAGNOSIS — I350 Nonrheumatic aortic (valve) stenosis: Secondary | ICD-10-CM

## 2019-11-08 NOTE — Progress Notes (Signed)
Subjective:   Patient: Calvin Thornton, male    DOB: [birth year], 79 y.o.   MRN: [id number]  Chief Complaint  Patient presents with  . AAA    Follow up   Virtual Visit via Telephone Note: Patient unable to use video assisted device.  This visit type was conducted due to national recommendations for restrictions regarding the COVID-19 Pandemic (e.g. social distancing).  This format is felt to be most appropriate for this patient at this time.  All issues noted in this document were discussed and addressed.  No physical exam was performed.  The patient has consented to conduct a Telehealth visit and understands insurance will be billed.   I connected with@, on 11/08/19 at 10AM by TELEPHONE and verified that I am speaking with the correct person using two identifiers.   I discussed the limitations of evaluation and management by telemedicine and the availability of in person appointments. The patient expressed understanding and agreed to proceed.   I have discussed with patient regarding the safety during COVID Pandemic and steps and precautions to be taken including social distancing, frequent hand wash and use of detergent soap, gels with the patient. I asked the patient to avoid touching mouth, nose, eyes, ears with the hands. I encouraged regular walking around the neighborhood and exercise and regular diet, as long as social distancing can be maintained.  HPI: 79 year old Caucasian male with prior history of prostate cancer status post treatment, abdominal aortic aneurysm scheduled today for a video visit to follow-up on test results.  Patient was running errands and therefore his video visit was transitioned to a telephone visit for today morning.  Since last office visit patient states that he has been doing well and has not had any cardiovascular symptoms of chest pain or shortness of breath at rest or with effort related activities.  Patient states that he lives a very active lifestyle  without any exertional symptoms.  Patient underwent a abdominal aortic duplex in March 2020 and was found to have a AAA measuring approximately 3 cm.  It was also noted to have increased velocities suggestive of less than 50% stenosis in the right internal iliac artery.  He had a repeat study in 10/29/2019, notes a stable abdominal aortic aneurysm measuring 3.04 x 3.05 x 3.09 cm.  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  Both of these findings are relatively stable in comparison to his prior study.  Since last office visit patient has started taking aspirin 81 mg p.o. daily.  Patient states that his blood pressure is very well controlled.  He recently went for his Medicare well visit and was noted to have a blood pressure of 106/64.  An average blood pressure at home is usually around 115/70.  Patient had his cholesterol checked back in December 2020 his total cholesterol according to him was 212, LDL less than 110, and his HDL was noted to be high.  I do not have physical panel for review.  He was encouraged to be on statin therapy given the findings mentioned above but he had refused in the past.   Patient reports that his mother developed abdominal aortic aneurysm later in life that did rupture at age of 52.   He is a retired Geneticist, molecular. Retired 2 years ago.   Past Medical History:  Diagnosis Date  . Heart murmur   . Prostate cancer [facility])     Past Surgical History:  Procedure Laterality Date  . BIOPSY PROSTATE  12/12/2017  . CYSTOSCOPY N/A 04/06/2018   Procedure: CYSTOSCOPY FLEXIBLE;  Surgeon: Cleon Gustin, MD;  Location: [facility];  Service: Urology;  Laterality: N/A;  no seeds found in bladder  . RADIOACTIVE SEED IMPLANT N/A 04/06/2018   Procedure: RADIOACTIVE SEED IMPLANT/BRACHYTHERAPY IMPLANT;  Surgeon: Cleon Gustin, MD;  Location: [facility];  Service: Urology;  Laterality:  N/A;    64    seeds implanted  . SEPTOPLASTY  1987  . SPACE OAR INSTILLATION N/A 04/06/2018   Procedure: SPACE OAR INSTILLATION;  Surgeon: Cleon Gustin, MD;  Location[facility];  Service: Urology;  Laterality: N/A;  . TRANSRECTAL ULTRA  12/12/2017  . VASECTOMY  2000    Family History  Problem Relation Age of Onset  . Colon cancer Paternal Uncle   . Colon cancer Paternal Grandfather   . AAA (abdominal aortic aneurysm) Mother   . Bleeding Disorder Father     Social History   Socioeconomic History  . Marital status: Married    Spouse name: Not on file  . Number of children: 1  . Years of education: Not on file  . Highest education level: Not on file  Occupational History  . Not on file  Tobacco Use  . Smoking status: Never Smoker  . Smokeless tobacco: Never Used  Substance and Sexual Activity  . Alcohol use: Yes    Comment: daily   . Drug use: Never  . Sexual activity: Yes  Other Topics Concern  . Not on file  Social History Narrative   Two adult children from previous marriage. 5 blood grandchildren plus 3 step grandchildren.   Social Determinants of Health   Financial Resource Strain:   . Difficulty of Paying Living Expenses:   Food Insecurity:   . Worried About Charity fundraiser in the Last Year:   . Arboriculturist in the Last Year:   Transportation Needs:   . Film/video editor (Medical):   Marland Kitchen Lack of Transportation (Non-Medical):   Physical Activity:   . Days of Exercise per Week:   . Minutes of Exercise per Session:   Stress:   . Feeling of Stress :   Social Connections:   . Frequency of Communication with Friends and Family:   . Frequency of Social Gatherings with Friends and Family:   . Attends Religious Services:   . Active Member of Clubs or Organizations:   . Attends Archivist Meetings:   Marland Kitchen Marital Status:   Intimate Partner Violence:   . Fear of Current or Ex-Partner:   . Emotionally Abused:   Marland Kitchen  Physically Abused:   . Sexually Abused:     Current Outpatient Medications on File Prior to Visit  Medication Sig Dispense Refill  . aspirin EC 81 MG tablet Take 81 mg by mouth daily.    . Multiple Vitamins-Minerals (MEGA MULTI MEN PO) Take by mouth.    . Leuprolide Acetate, 6 Month, (LUPRON DEPOT, 69-MONTH, IM) Inject into the muscle every 6 (six) months.    . pseudoephedrine-acetaminophen (TYLENOL SINUS) 30-500 MG TABS tablet Take 1 tablet by mouth daily after breakfast.     No current facility-administered medications on file prior to visit.    Review of Systems  Constitution: Negative for decreased appetite, malaise/fatigue, weight gain and weight loss.  Eyes: Negative for visual disturbance.  Cardiovascular: Negative for chest pain, claudication, dyspnea on exertion, leg swelling,  orthopnea, palpitations and syncope.  Respiratory: Negative for hemoptysis and wheezing.   Endocrine: Negative for cold intolerance and heat intolerance.  Hematologic/Lymphatic: Does not bruise/bleed easily.  Skin: Negative for nail changes.  Musculoskeletal: Negative for muscle weakness and myalgias.  Gastrointestinal: Negative for abdominal pain, change in bowel habit, nausea and vomiting.  Neurological: Negative for difficulty with concentration, dizziness, focal weakness and headaches.  Psychiatric/Behavioral: Negative for altered mental status and suicidal ideas.  All other systems reviewed and are negative.      Objective:   Physical Examination noted below is from his last in office-visit (for reference).   Constitutional: He is oriented to person, place, and time. Vital signs are normal. He appears well-developed and well-nourished.  HENT:  Head: Normocephalic and atraumatic.  Neck: Normal range of motion.  Cardiovascular: Regular rhythm and intact distal pulses. Bradycardia present.  Murmur heard.  Harsh midsystolic murmur is present at the upper right sternal border radiating to the  neck. Pulmonary/Chest: Effort normal and breath sounds normal. No accessory muscle usage. No respiratory distress.  Abdominal: Soft. Bowel sounds are normal.  Musculoskeletal: Normal range of motion.  Neurological: He is alert and oriented to person, place, and time.  Skin: Skin is warm and dry.   Cardiac Studies:   Echocardiogram 11/16/2018: Left ventricle cavity is normal in size. Mild concentric hypertrophy of the left ventricle. Normal global wall motion. Normal diastolic filling pattern. Calculated EF 55%. Right atrial cavity is normal in size. Mild aortic valve stenosis.  Aortic valve mean gradient of 9 mmHg, Vmax of 2.3  m/s. Calculated aortic valve area by continuity equation is 1.1 cm. (Based on 2D images, valve area appears underestimated.) Mild (Grade I) regurgitation. Mild tricuspid regurgitation. Estimated pulmonary artery systolic pressure 28 mmHg.  Abdominal Aortic Duplex: 09/22/2015: Aortoiliac ectasia. Maximum diameter of the abdominal aorta is 2.6 cm. Ectatic abdominal aorta at risk for aneurysm development. Recommend followup by ultrasound in 5 years.  11/16/2018: Moderate dilatation of the abdominal aorta is noted in the mid aorta.  An abdominal aortic aneurysm measuring 3 x 3.02 x 3.01 cm is seen.  Peak systolic velocities in the right internal iliac artery are mildly increased to 197.86 cm/s, <50% stenosis. Enlarged inferior vena cava with normal respiratory variation suggests elevated right heart pressure.  Recheck in 1 year.  10/29/2019: Moderate dilatation of the abdominal aorta is noted in the mid aorta. An abdominal aortic aneurysm measuring 3.04 x 3.05 x 3.09 cm is seen. Mild plaque noted in the proximal, mid and distal aorta. There is aortic  ulceration noted in the proximal aorta (very small). Recheck in 6 months to evaluate stability of the aneurysm and ulcer (see image enclosed).  Overall no significant change from 11/16/2018. Clinical correlation recommended.  Mild ulceration was previously noted as well.       Assessment & Recommendations:     ICD-10-CM   1. Abdominal aortic aneurysm (AAA) without rupture (HCC)  I71.4   2. Mild aortic stenosis  I35.0   3. Encounter to discuss test results  Z71.2    1. Abdominal aortic aneurysm (AAA) without rupture ([place]) Went over the findings of the abdominal aortic duplex from March 2021 in great detail with the patient at today's visit.  No significant change in his AAA dimensions.  He is noted to have a small ulceration in the proximal aorta which was present in the past.  Recommended a 109-month follow-up to reevaluate disease progression and/or stability.  Continue aspirin 81 mg p.o. daily.  I also asked the patient to be on statin therapy.  Patient would like to read more about this prior to initiating statin therapy. No history of tobacco use. No symptoms of claudication.   Counseled that moderate physical activity such as running, biking, swimming, hiking, and activities such as gardening, golfing, and horseback riding do not precipitate AAA rupture. However, heavy lifting, especially while holding the breath, and other activities that lead to Valsalva transiently induce significant increases in blood pressure and should be avoided.   2. Mild aortic stenosis: Continue with clinical surveillance. Repeating echocardiogram in 3 years (10/2021) or sooner if clinically indicated.   3. Encounter to review test results: You the results of the abdominal duplex.  Recommendations provided as outlined above.  His questions and concerns addressed to his satisfaction.  Rex Kras, DO, [facility]. [place] Office: [phone]

## 2020-05-02 ENCOUNTER — Other Ambulatory Visit: Payer: Medicare Other

## 2020-05-09 ENCOUNTER — Ambulatory Visit: Payer: Medicare Other | Admitting: Cardiology

## 2020-05-14 ENCOUNTER — Ambulatory Visit: Payer: Medicare Other

## 2020-05-14 ENCOUNTER — Other Ambulatory Visit: Payer: Self-pay

## 2020-05-14 DIAGNOSIS — I714 Abdominal aortic aneurysm, without rupture, unspecified: Secondary | ICD-10-CM

## 2020-05-19 ENCOUNTER — Telehealth: Payer: Medicare Other | Admitting: Cardiology

## 2020-05-20 ENCOUNTER — Other Ambulatory Visit: Payer: Self-pay | Admitting: Cardiology

## 2020-05-20 DIAGNOSIS — I714 Abdominal aortic aneurysm, without rupture, unspecified: Secondary | ICD-10-CM

## 2020-05-20 NOTE — Progress Notes (Signed)
Spoke to patient, he is aware sch/rma

## 2020-05-27 ENCOUNTER — Telehealth: Payer: Medicare Other | Admitting: Cardiology

## 2020-06-04 ENCOUNTER — Telehealth: Payer: Medicare Other | Admitting: Cardiology

## 2020-06-04 ENCOUNTER — Encounter: Payer: Self-pay | Admitting: Cardiology

## 2020-06-04 DIAGNOSIS — I350 Nonrheumatic aortic (valve) stenosis: Secondary | ICD-10-CM

## 2020-06-04 DIAGNOSIS — I714 Abdominal aortic aneurysm, without rupture, unspecified: Secondary | ICD-10-CM

## 2020-06-04 DIAGNOSIS — Z712 Person consulting for explanation of examination or test findings: Secondary | ICD-10-CM

## 2020-06-04 MED ORDER — ASPIRIN EC 81 MG PO TBEC: 81.0000 mg | DELAYED_RELEASE_TABLET | Freq: Every day | ORAL | 0 refills | Status: AC

## 2020-06-04 NOTE — Progress Notes (Signed)
Subjective:   Patient: Calvin Thornton, male    DOB: 05/09/1941, 79 y.o.   MRN: [id number]   Date: 06/04/20 Last Office Visit: 11/08/2019  Chief Complaint  Patient presents with  . AAA  . Follow-up    6 month   . Results   Virtual Visit via Telephone Note: Patient unable to use video assisted device.  This visit type was conducted due to national recommendations for restrictions regarding the COVID-19 Pandemic (e.g. social distancing).  This format is felt to be most appropriate for this patient at this time.  All issues noted in this document were discussed and addressed.  No physical exam was performed.  The patient has consented to conduct a Telehealth visit and understands insurance will be billed.   I connected with@, on 06/04/2020 at 230pm  by TELEPHONE and verified that I am speaking with the correct person using two identifiers.   I discussed the limitations of evaluation and management by telemedicine and the availability of in person appointments. The patient expressed understanding and agreed to proceed.   I have discussed with patient regarding the safety during COVID Pandemic and steps and precautions to be taken including social distancing, frequent hand wash and use of detergent soap, gels with the patient. I asked the patient to avoid touching mouth, nose, eyes, ears with the hands. I encouraged regular walking around the neighborhood and exercise and regular diet, as long as social distancing can be maintained.  HPI: 79 year old Caucasian male with prior history of prostate cancer status post treatment, abdominal aortic aneurysm scheduled today for a virtual visit to follow-up on test results.  Since last office visit patient states that he has been doing well and has not had chest pain or shortness of breath at rest or with effort related activities.  Patient states that he lives a very active lifestyle without change in physical endurance.  Since last office visit  patient underwent a follow-up abdominal aortic duplex to evaluate his abdominal aortic aneurysm.  The dimensions of the aortic aneurysm appear to remain stable.  He continues to have diffuse tenderness plaque.  Follow-up recommended in 3 years.  Reinforced the importance of aspirin 81 mg p.o. daily. He is still considering initiation of statin therapy.  Patient also has underlying mild aortic stenosis.  He denies any cardinal symptoms of congestive heart failure, angina pectoris, or syncope.  Patient states that he has moved to [facility] and will transition his care to [place].  Patient reports that his mother developed abdominal aortic aneurysm later in life that did rupture at age of 70.   Past Medical History:  Diagnosis Date  . AAA (abdominal aortic aneurysm) ([place])   . Heart murmur   . Prostate cancer [facility])     Past Surgical History:  Procedure Laterality Date  . BIOPSY PROSTATE  12/12/2017  . CYSTOSCOPY N/A 04/06/2018   Procedure: CYSTOSCOPY FLEXIBLE;  Surgeon: Cleon Gustin, MD;  Location: [facility];  Service: Urology;  Laterality: N/A;  no seeds found in bladder  . RADIOACTIVE SEED IMPLANT N/A 04/06/2018   Procedure: RADIOACTIVE SEED IMPLANT/BRACHYTHERAPY IMPLANT;  Surgeon: Cleon Gustin, MD;  Location: [facility];  Service: Urology;  Laterality: N/A;    64    seeds implanted  . SEPTOPLASTY  1987  . SPACE OAR INSTILLATION N/A 04/06/2018   Procedure: SPACE OAR INSTILLATION;  Surgeon: Cleon Gustin, MD;  Location: [facility];  Service: Urology;  Laterality: N/A;  .  TRANSRECTAL ULTRA  12/12/2017  . VASECTOMY  2000    Family History  Problem Relation Age of Onset  . Colon cancer Paternal Uncle   . Colon cancer Paternal Grandfather   . AAA (abdominal aortic aneurysm) Mother   . Bleeding Disorder Father     Social History   Tobacco Use  . Smoking status: Never Smoker  . Smokeless tobacco: Never Used    Vaping Use  . Vaping Use: Never used  Substance Use Topics  . Alcohol use: Yes    Comment: daily   . Drug use: Never   Current Outpatient Medications on File Prior to Visit  Medication Sig Dispense Refill  . Multiple Vitamins-Minerals (MEGA MULTI MEN PO) Take by mouth.    . tadalafil (CIALIS) 20 MG tablet Take 1 tablet by mouth as needed.     No current facility-administered medications on file prior to visit.    Review of Systems  Constitutional: Negative for decreased appetite, malaise/fatigue, weight gain and weight loss.  Eyes: Negative for visual disturbance.  Cardiovascular: Negative for chest pain, claudication, dyspnea on exertion, leg swelling, orthopnea, palpitations and syncope.  Respiratory: Negative for hemoptysis and wheezing.   Endocrine: Negative for cold intolerance and heat intolerance.  Hematologic/Lymphatic: Does not bruise/bleed easily.  Skin: Negative for nail changes.  Musculoskeletal: Negative for muscle weakness and myalgias.  Gastrointestinal: Negative for abdominal pain, change in bowel habit, nausea and vomiting.  Neurological: Negative for difficulty with concentration, dizziness, focal weakness and headaches.  Psychiatric/Behavioral: Negative for altered mental status and suicidal ideas.  All other systems reviewed and are negative.     Objective:   Physical Examination noted below is from his last in office-visit (for reference).   Constitutional: He is oriented to person, place, and time. Vital signs are normal. He appears well-developed and well-nourished.  HENT:  Head: Normocephalic and atraumatic.  Neck: Normal range of motion.  Cardiovascular: Regular rhythm and intact distal pulses. Bradycardia present.  Murmur heard.  Harsh midsystolic murmur is present at the upper right sternal border radiating to the neck. Pulmonary/Chest: Effort normal and breath sounds normal. No accessory muscle usage. No respiratory distress.  Abdominal: Soft.  Bowel sounds are normal.  Musculoskeletal: Normal range of motion.  Neurological: He is alert and oriented to person, place, and time.  Skin: Skin is warm and dry.   Cardiac Studies:   Echocardiogram 11/16/2018: Left ventricle cavity is normal in size. Mild concentric hypertrophy of the left ventricle. Normal global wall motion. Normal diastolic filling pattern. Calculated EF 55%. Right atrial cavity is normal in size. Mild aortic valve stenosis.  Aortic valve mean gradient of 9 mmHg, Vmax of 2.3  m/s. Calculated aortic valve area by continuity equation is 1.1 cm. (Based on 2D images, valve area appears underestimated.) Mild (Grade I) regurgitation. Mild tricuspid regurgitation. Estimated pulmonary artery systolic pressure 28 mmHg.  Abdominal Aortic Duplex: 11/16/2018: Moderate dilatation of the abdominal aorta is noted in the mid aorta.  An abdominal aortic aneurysm measuring 3 x 3.02 x 3.01 cm is seen.  Peak systolic velocities in the right internal iliac artery are mildly increased to 197.86 cm/s, <50% stenosis. Enlarged inferior vena cava with normal respiratory variation suggests elevated right heart pressure.  Recheck in 1 year.  10/29/2019: Moderate dilatation of the abdominal aorta is noted in the mid aorta. An abdominal aortic aneurysm measuring 3.04 x 3.05 x 3.09 cm is seen. Mild plaque noted in the proximal, mid and distal aorta. There is aortic  ulceration noted in the proximal aorta (very small). Recheck in 6 months to evaluate stability of the aneurysm and ulcer (see image enclosed).  Overall no significant change from 11/16/2018. Clinical correlation recommended. Mild ulceration was previously noted as well.    05/14/2020:  An abdominal aortic aneurysm measuring 3.06 x 3.02 x 3 cm is seen in the  mid aorta. Diffuse heterogeneous plaque noted in the abdominal aorta.  Right Iliac artery velocity mildly elevated with <50% stenosis. NOrmal  right iliac artery velocity.  No  change from 10/29/2019. Recheck in 3 years.     Assessment & Recommendations:     ICD-10-CM   1. Abdominal aortic aneurysm (AAA) without rupture (HCC)  I71.4 aspirin EC 81 MG tablet  2. Mild aortic stenosis  I35.0   3. Encounter to discuss test results  Z71.2    1. Abdominal aortic aneurysm (AAA) without rupture ([place]) Reviewed the most recent abdominal duplex with the patient over the phone.  His underlying abdominal aortic aneurysm remains stable.  Recommended to continue aspirin 81 mg p.o. daily.  During last encounter we discussed initiating statin therapy however patient was reluctant and wanted to read more about it.  He has not initiated statin therapy and he is encouraged to possibly follow-up with this either with his PCP or when he follows up with [facility] cardiology. He is educated to avoid heavy lifting, especially while holding the breath, and other activities that lead to Valsalva transiently induce significant increases in blood pressure and should be avoided.  He is a non-smoker. Patient states that his home blood pressures are well controlled.  Last known blood pressure is 115/75 with a pulse of 52 according to the patient.  2. Mild aortic stenosis: Continue with clinical surveillance. Repeating echocardiogram in 3 years (10/2021) or sooner if clinically indicated.   Patient has moved to [facility] and will be transitioning his care to [place].  Patient is reassured that we will help with the transition and he is more than welcome to follow-up with [place] if he has difficulty establishing care at [facility].  Total time spent 21 minutes.  Rex Kras, [place], [facility]  Pager: [phone] Office: [phone]

## 2020-06-10 ENCOUNTER — Other Ambulatory Visit: Payer: Self-pay

## 2020-06-10 DIAGNOSIS — I350 Nonrheumatic aortic (valve) stenosis: Secondary | ICD-10-CM
